# Patient Record
Sex: Female | Born: 1965
Health system: Southern US, Community
[De-identification: ages and names within clinical notes are randomized; demographics above are authoritative.]

## PROBLEM LIST (undated history)

## (undated) DIAGNOSIS — G43909 Migraine, unspecified, not intractable, without status migrainosus: Secondary | ICD-10-CM

## (undated) DIAGNOSIS — F419 Anxiety disorder, unspecified: Secondary | ICD-10-CM

## (undated) HISTORY — PX: OTHER SURGICAL HISTORY: SHX169

## (undated) HISTORY — PX: GANGLION CYST EXCISION: SHX1691

## (undated) HISTORY — PX: SPINE SURGERY: SHX786

## (undated) HISTORY — DX: Migraine, unspecified, not intractable, without status migrainosus: G43.909

---

## 2014-03-10 ENCOUNTER — Ambulatory Visit (INDEPENDENT_AMBULATORY_CARE_PROVIDER_SITE_OTHER): Payer: BC Managed Care – PPO | Admitting: Neurology

## 2014-03-10 ENCOUNTER — Encounter: Payer: Self-pay | Admitting: Neurology

## 2014-03-10 VITALS — BP 128/72 | HR 83 | Ht 62.0 in | Wt 112.0 lb

## 2014-03-10 DIAGNOSIS — R519 Headache, unspecified: Secondary | ICD-10-CM

## 2014-03-10 DIAGNOSIS — R42 Dizziness and giddiness: Secondary | ICD-10-CM | POA: Insufficient documentation

## 2014-03-10 DIAGNOSIS — R51 Headache: Secondary | ICD-10-CM

## 2014-03-10 MED ORDER — PROMETHAZINE HCL 12.5 MG PO TABS: 12.5000 mg | ORAL_TABLET | Freq: Four times a day (QID) | ORAL | Status: DC | PRN

## 2014-03-10 MED ORDER — PREDNISONE 10 MG PO TABS: ORAL_TABLET | ORAL | Status: DC

## 2014-03-10 NOTE — Progress Notes (Signed)
NEUROLOGY CONSULTATION NOTE  Catherine Hartman MRN: [id number] DOB: May 07, 1966  Referring provider: Dr. Shirlean Mylararol Webb Primary care provider: Dr. Mila PalmerSharon Wolters  Reason for consult:  Daily headaches, new dizziness  Dear Dr Hyman HopesWebb:  Thank you for your kind referral of Catherine Hartman for consultation of the above symptoms. Although her history is well known to you, please allow me to reiterate it for the purpose of our medical record. Records and images were personally reviewed where available.  HISTORY OF PRESENT ILLNESS: This is a 48 year old right-handed woman with no significant past medical history, presenting for daily headaches since age 48.  She reports having severe headaches at age 48, she recalls having biofeedback at that time and the headaches eventually resolved.  At age 48, she started having headaches over the frontal and occipital region. If localized, she would have them on the left side of her face. She describes the pain "like a knife in my brain."  She has severe nausea, photo and phonophobia.  If the pain gets to a 5/10, she would take Imitrex, otherwise she would start vomiting.  Over the past 2 months, she has had a different type of headache that are different from her migraines, with diffuse pressure and squeezing pain, and associated dizziness that lasted a day or so.  Dizziness is "not spinning," she feels that she cannot focus on anything.  Over the past 1-1/2 weeks, she has had only 2 days where she feels better, otherwise headaches and dizziness are worse in the morning. She works as a Quarry managercomputer programmer, and has difficulty looking at a computer.  Bright like makes her vision blurred, with worsened pulsating headaches and palpitations.  She feels unsteady walking, denies any falls but has bumped into things.  She has been taking Imitrex near-daily, but states that she can go 5 days without taking this.    She has been on OCPs and notices worsening of headaches on the  inert pills, however she has tried tricycling twice and had severe gastritis.  She has tried a calcium blocker in the past which made her feel "strange" with more headaches.  She denies any diplopia, dysarthria, dysphagia, bowel/bladder dysfunction.  She has some neck tension and occasional numbness in both legs.  She has a prescription for amitriptyline but has not started this yet. There is no family history of migraines.  Her maternal grandmother had a brain tumor.  Laboratory Data: 08/2013: CBC, TSH, ferritin normal.  PAST MEDICAL HISTORY: Past Medical History  Diagnosis Date  . Migraine     PAST SURGICAL HISTORY: Past Surgical History  Procedure Laterality Date  . Ganglion cyst excision  age 48    MEDICATIONS: No current outpatient prescriptions on file prior to visit.   No current facility-administered medications on file prior to visit.    ALLERGIES: Allergies  Allergen Reactions  . Tetracyclines & Related Hives    FAMILY HISTORY: Family History  Problem Relation Age of Onset  . Hypertension Father     SOCIAL HISTORY: History   Social History  . Marital Status: Married    Spouse Name: N/A    Number of Children: N/A  . Years of Education: N/A   Occupational History  . Not on file.   Social History Main Topics  . Smoking status: Current Every Day Smoker  . Smokeless tobacco: Not on file  . Alcohol Use: No  . Drug Use: No  . Sexual Activity: Not on file   Other Topics Concern  .  Not on file   Social History Narrative  . No narrative on file    REVIEW OF SYSTEMS: Constitutional: No fevers, chills, or sweats, no generalized fatigue, change in appetite Eyes: No visual changes, double vision, eye pain Ear, nose and throat: No hearing loss, ear pain, nasal congestion, sore throat Cardiovascular: No chest pain, palpitations Respiratory:  No shortness of breath at rest or with exertion, wheezes GastrointestinaI: No nausea, vomiting, diarrhea, abdominal  pain, fecal incontinence Genitourinary:  No dysuria, urinary retention or frequency Musculoskeletal:  + neck pain, back pain Integumentary: No rash, pruritus, skin lesions Neurological: as above Psychiatric: No depression, insomnia, anxiety Endocrine: No palpitations, fatigue, diaphoresis, mood swings, change in appetite, change in weight, increased thirst Hematologic/Lymphatic:  No anemia, purpura, petechiae. Allergic/Immunologic: no itchy/runny eyes, nasal congestion, recent allergic reactions, rashes  PHYSICAL EXAM: Filed Vitals:   03/10/14 1338  BP: 128/72  Pulse: 83   General: No acute distress Head:  Normocephalic/atraumatic Eyes: Fundoscopic exam shows bilateral sharp discs, no vessel changes, exudates, or hemorrhages Neck: supple, no paraspinal tenderness, full range of motion Back: No paraspinal tenderness Heart: regular rate and rhythm Lungs: Clear to auscultation bilaterally. Vascular: No carotid bruits. Skin/Extremities: No rash, no edema Neurological Exam: Mental status: alert and oriented to person, place, and time, no dysarthria or aphasia, Fund of knowledge is appropriate.  Recent and remote memory are intact.  Attention and concentration are normal.    Able to name objects and repeat phrases. Cranial nerves: CN I: not tested CN II: pupils equal, round and reactive to light, visual fields intact, fundi unremarkable. CN III, IV, VI:  full range of motion, no nystagmus, no ptosis CN V: facial sensation intact CN VII: upper and lower face symmetric CN VIII: hearing intact to finger rub CN IX, X: gag intact, uvula midline CN XI: sternocleidomastoid and trapezius muscles intact CN XII: tongue midline Bulk & Tone: normal, no fasciculations. Motor: 5/5 throughout with no pronator drift. Sensation: intact to light touch, cold, pin, vibration and joint position sense.  No extinction to double simultaneous stimulation.  Romberg test negative Deep Tendon Reflexes: +2  throughout, no ankle clonus Plantar responses: downgoing bilaterally Cerebellar: no incoordination on finger to nose, heel to shin. No dysdiadochokinesia Gait: narrow-based and steady, able to tandem walk adequately. Tremor: none  IMPRESSION: This is a 48 year old right-handed woman with a history of headaches since age 17 suggestive of migraines without aura.  She has had an increase in headache frequency and change in headaches. She also has new onset dizziness described as difficulty focusing and loss of balance, now occurring on a daily basis.  An MRI brain with and without contrast will be ordered to assess for underlying structural abnormality.  There is likely a component of medication overuse however she then stated that she can go 5 days without taking Imitrex.  We discussed minimizing rescue medication to 2-3/week. She will take phenergan for nausea associated with migraines.  She will start a course of steroids to hopefully break this headache cycle.  She will benefit from a headache prophylactic medication, and has a prescription for amitriptyline.  We discussed side effects, she will start on low dose and uptitrate as tolerated.  She will keep a headache diary and follow-up in 3 months.  Thank you for allowing me to participate in the care of this patient. Please do not hesitate to call for any questions or concerns.   Patrcia Dolly, M.D.

## 2014-03-10 NOTE — Patient Instructions (Signed)
1. MRI brain with and without contrast 2. Start Prednisone 10mg : Take 6 tabs on day 1, 5 tabs on day 2, 4 tabs on day 3, 3 tabs on day 4, 2 tabs on days 5 and 6, 1 tab on day 7 and 8. 3. Start Amitriptyline 10mg : Take 1 tab at bedtime for 2 weeks, then increase to 2 tabs at bedtime 4. May take as needed Phenergan for nausea 5. Minimize Imitrex use to 2-3 times a week to avoid rebound headaches 6. Keep a headache calendar

## 2014-03-12 ENCOUNTER — Encounter: Payer: Self-pay | Admitting: Neurology

## 2014-03-25 ENCOUNTER — Ambulatory Visit (HOSPITAL_COMMUNITY)
Admission: RE | Admit: 2014-03-25 | Discharge: 2014-03-25 | Disposition: A | Discharge status: Home / Self Care | Payer: BC Managed Care – PPO | Source: Ambulatory Visit | Attending: Neurology | Admitting: Neurology

## 2014-03-25 DIAGNOSIS — R51 Headache: Secondary | ICD-10-CM | POA: Insufficient documentation

## 2014-03-25 DIAGNOSIS — R519 Headache, unspecified: Secondary | ICD-10-CM

## 2014-03-25 DIAGNOSIS — R42 Dizziness and giddiness: Secondary | ICD-10-CM | POA: Insufficient documentation

## 2014-03-25 MED ORDER — GADOBENATE DIMEGLUMINE 529 MG/ML IV SOLN
10.0000 mL | Freq: Once | INTRAVENOUS | Status: AC
  Administered 2014-03-25: 10 mL via INTRAVENOUS

## 2014-03-27 ENCOUNTER — Telehealth: Payer: Self-pay | Admitting: Neurology

## 2014-03-27 NOTE — Telephone Encounter (Signed)
Please review MRI patient also states that her sight has getting worse she called it shadow vision(?).

## 2014-03-27 NOTE — Telephone Encounter (Signed)
Pt wants to know the results of the test and her vision is getting bad please call [phone]

## 2014-03-27 NOTE — Telephone Encounter (Signed)
Patient notified

## 2014-03-27 NOTE — Telephone Encounter (Signed)
Unable to reach patient will try again later 

## 2014-03-27 NOTE — Telephone Encounter (Signed)
Pls let her know I reviewed MRI brain, it is normal, no evidence of tumor, stroke, bleed, or inflammation. Would make sure she sees the eye doctor if she hasn't already, to make sure there is no primary eye problem as well. She can increase amitriptyline by 1 tablet at bedtime. Thanks

## 2014-07-07 ENCOUNTER — Encounter: Payer: Self-pay | Admitting: *Deleted

## 2016-04-24 DIAGNOSIS — H35421 Microcystoid degeneration of retina, right eye: Secondary | ICD-10-CM | POA: Diagnosis not present

## 2016-04-24 DIAGNOSIS — H43312 Vitreous membranes and strands, left eye: Secondary | ICD-10-CM | POA: Diagnosis not present

## 2016-04-24 DIAGNOSIS — H35422 Microcystoid degeneration of retina, left eye: Secondary | ICD-10-CM | POA: Diagnosis not present

## 2016-04-24 DIAGNOSIS — H4710 Unspecified papilledema: Secondary | ICD-10-CM | POA: Diagnosis not present

## 2016-04-24 DIAGNOSIS — G43001 Migraine without aura, not intractable, with status migrainosus: Secondary | ICD-10-CM | POA: Diagnosis not present

## 2016-04-24 DIAGNOSIS — H43311 Vitreous membranes and strands, right eye: Secondary | ICD-10-CM | POA: Diagnosis not present

## 2016-04-28 ENCOUNTER — Other Ambulatory Visit (HOSPITAL_COMMUNITY): Payer: Self-pay | Admitting: Ophthalmology

## 2016-04-28 DIAGNOSIS — R519 Headache, unspecified: Secondary | ICD-10-CM

## 2016-04-28 DIAGNOSIS — H4710 Unspecified papilledema: Secondary | ICD-10-CM

## 2016-04-28 DIAGNOSIS — R51 Headache: Secondary | ICD-10-CM

## 2016-05-01 DIAGNOSIS — Z9889 Other specified postprocedural states: Secondary | ICD-10-CM | POA: Diagnosis not present

## 2016-05-01 DIAGNOSIS — R51 Headache: Secondary | ICD-10-CM | POA: Diagnosis not present

## 2016-05-01 DIAGNOSIS — H578 Other specified disorders of eye and adnexa: Secondary | ICD-10-CM | POA: Diagnosis not present

## 2016-05-01 DIAGNOSIS — H538 Other visual disturbances: Secondary | ICD-10-CM | POA: Diagnosis not present

## 2016-05-01 DIAGNOSIS — H04123 Dry eye syndrome of bilateral lacrimal glands: Secondary | ICD-10-CM | POA: Diagnosis not present

## 2016-05-01 DIAGNOSIS — G43909 Migraine, unspecified, not intractable, without status migrainosus: Secondary | ICD-10-CM | POA: Diagnosis not present

## 2016-07-17 DIAGNOSIS — L570 Actinic keratosis: Secondary | ICD-10-CM | POA: Diagnosis not present

## 2016-07-17 DIAGNOSIS — L820 Inflamed seborrheic keratosis: Secondary | ICD-10-CM | POA: Diagnosis not present

## 2016-08-05 ENCOUNTER — Emergency Department (HOSPITAL_BASED_OUTPATIENT_CLINIC_OR_DEPARTMENT_OTHER)
Admission: EM | Admit: 2016-08-05 | Discharge: 2016-08-05 | Disposition: A | Discharge status: Home / Self Care | Payer: BLUE CROSS/BLUE SHIELD | Attending: Emergency Medicine | Admitting: Emergency Medicine

## 2016-08-05 ENCOUNTER — Encounter (HOSPITAL_BASED_OUTPATIENT_CLINIC_OR_DEPARTMENT_OTHER): Payer: Self-pay | Admitting: Emergency Medicine

## 2016-08-05 DIAGNOSIS — Z87891 Personal history of nicotine dependence: Secondary | ICD-10-CM | POA: Insufficient documentation

## 2016-08-05 DIAGNOSIS — Z79899 Other long term (current) drug therapy: Secondary | ICD-10-CM | POA: Insufficient documentation

## 2016-08-05 DIAGNOSIS — R1013 Epigastric pain: Secondary | ICD-10-CM | POA: Diagnosis not present

## 2016-08-05 DIAGNOSIS — R110 Nausea: Secondary | ICD-10-CM | POA: Insufficient documentation

## 2016-08-05 HISTORY — DX: Anxiety disorder, unspecified: F41.9

## 2016-08-05 HISTORY — DX: Migraine, unspecified, not intractable, without status migrainosus: G43.909

## 2016-08-05 LAB — LIPASE, BLOOD: LIPASE: 20 U/L (ref 11–51)

## 2016-08-05 LAB — COMPREHENSIVE METABOLIC PANEL
ALBUMIN: 4.1 g/dL (ref 3.5–5.0)
ALT: 11 U/L — AB (ref 14–54)
AST: 20 U/L (ref 15–41)
Alkaline Phosphatase: 43 U/L (ref 38–126)
Anion gap: 7 (ref 5–15)
BUN: 11 mg/dL (ref 6–20)
CHLORIDE: 108 mmol/L (ref 101–111)
CO2: 25 mmol/L (ref 22–32)
CREATININE: 0.88 mg/dL (ref 0.44–1.00)
Calcium: 9 mg/dL (ref 8.9–10.3)
GFR calc Af Amer: >60 mL/min (ref >60)
GLUCOSE: 100 mg/dL — AB (ref 65–99)
POTASSIUM: 3.6 mmol/L (ref 3.5–5.1)
Sodium: 140 mmol/L (ref 135–145)
Total Bilirubin: 0.4 mg/dL (ref 0.3–1.2)
Total Protein: 6.8 g/dL (ref 6.5–8.1)

## 2016-08-05 LAB — URINALYSIS, ROUTINE W REFLEX MICROSCOPIC
Bilirubin Urine: NEGATIVE
GLUCOSE, UA: NEGATIVE mg/dL
HGB URINE DIPSTICK: NEGATIVE
Ketones, ur: NEGATIVE mg/dL
LEUKOCYTES UA: NEGATIVE
Nitrite: NEGATIVE
PH: 7 (ref 5.0–8.0)
PROTEIN: NEGATIVE mg/dL
SPECIFIC GRAVITY, URINE: 1.014 (ref 1.005–1.030)

## 2016-08-05 LAB — CBC WITH DIFFERENTIAL/PLATELET
BASOS ABS: 0 10*3/uL (ref 0.0–0.1)
BASOS PCT: 0 %
EOS PCT: 3 %
Eosinophils Absolute: 0.2 10*3/uL (ref 0.0–0.7)
HCT: 40.7 % (ref 36.0–46.0)
Hemoglobin: 13.8 g/dL (ref 12.0–15.0)
LYMPHS PCT: 38 %
Lymphs Abs: 2.7 10*3/uL (ref 0.7–4.0)
MCH: 31.2 pg (ref 26.0–34.0)
MCHC: 33.9 g/dL (ref 30.0–36.0)
MCV: 92.1 fL (ref 78.0–100.0)
Monocytes Absolute: 0.6 10*3/uL (ref 0.1–1.0)
Monocytes Relative: 8 %
NEUTROS ABS: 3.7 10*3/uL (ref 1.7–7.7)
Neutrophils Relative %: 51 %
PLATELETS: 198 10*3/uL (ref 150–400)
RBC: 4.42 MIL/uL (ref 3.87–5.11)
RDW: 13.3 % (ref 11.5–15.5)
WBC: 7.2 10*3/uL (ref 4.0–10.5)

## 2016-08-05 LAB — PREGNANCY, URINE: Preg Test, Ur: NEGATIVE

## 2016-08-05 MED ORDER — GI COCKTAIL ~~LOC~~: 30.0000 mL | Freq: Once | ORAL | Status: DC

## 2016-08-05 MED ORDER — ONDANSETRON HCL 4 MG/2ML IJ SOLN
4.0000 mg | Freq: Once | INTRAMUSCULAR | Status: AC
  Administered 2016-08-05: 4 mg via INTRAVENOUS
  Filled 2016-08-05: qty 2

## 2016-08-05 MED ORDER — DICYCLOMINE HCL 10 MG PO CAPS
10.0000 mg | ORAL_CAPSULE | Freq: Once | ORAL | Status: AC
  Administered 2016-08-05: 10 mg via ORAL
  Filled 2016-08-05: qty 1

## 2016-08-05 MED ORDER — OXYCODONE-ACETAMINOPHEN 5-325 MG PO TABS: 1.0000 | ORAL_TABLET | ORAL | 0 refills | Status: DC | PRN

## 2016-08-05 MED ORDER — ONDANSETRON 4 MG PO TBDP: 4.0000 mg | ORAL_TABLET | Freq: Three times a day (TID) | ORAL | 0 refills | Status: DC | PRN

## 2016-08-05 MED ORDER — FAMOTIDINE IN NACL 20-0.9 MG/50ML-% IV SOLN
20.0000 mg | INTRAVENOUS | Status: AC
  Administered 2016-08-05: 20 mg via INTRAVENOUS
  Filled 2016-08-05: qty 50

## 2016-08-05 MED ORDER — MORPHINE SULFATE (PF) 4 MG/ML IV SOLN
4.0000 mg | Freq: Once | INTRAVENOUS | Status: DC
  Filled 2016-08-05: qty 1

## 2016-08-05 MED ORDER — SODIUM CHLORIDE 0.9 % IV BOLUS (SEPSIS)
1000.0000 mL | Freq: Once | INTRAVENOUS | Status: AC
  Administered 2016-08-05: 1000 mL via INTRAVENOUS

## 2016-08-05 MED ORDER — OMEPRAZOLE 20 MG PO CPDR: 20.0000 mg | DELAYED_RELEASE_CAPSULE | Freq: Every day | ORAL | 0 refills | Status: DC

## 2016-08-05 NOTE — Discharge Instructions (Signed)
Take the prescribed medication as directed. Follow-up with your primary care doctor. If symptoms do not seem to be improving, may wish to follow-up with GI doctor. Return to the ED for new or worsening symptoms.

## 2016-08-05 NOTE — ED Triage Notes (Signed)
Pt states epigastric pain off and on since about 6pm last night.  Pt reports steady ache with intermittant waves of sharp pain.  Pt also states menstrual bleeding for 3 weeks off and on.

## 2016-08-05 NOTE — ED Provider Notes (Signed)
[facility] Provider Note   CSN: [id number] Arrival date & time: 08/05/16  1345     History   Chief Complaint Chief Complaint  Patient presents with  . Abdominal Pain    HPI Catherine Hartman is a 50 y.o. female.   Abdominal Pain   Associated symptoms include nausea.   50 year old female with history of anxiety, migraine headaches, presenting to the ED for abdominal pain. She states this began around 6 PM yesterday evening. States pain has been waxing and waning in severity, is severe when it is most intense. States it feels like "spasms" in her upper abdomen.  She reports some mild nausea without vomiting or diarrhea. States she has not eaten in the past 24 hours. States pain is not necessarily worse with eating. No fever or chills. No sick contacts. No prior abdominal surgeries. No history of GI issues. States she tried some Gas-X and Pepcid at home without relief.  No chest pain or SOB.  Past Medical History:  Diagnosis Date  . Anxiety   . Migraines     There are no active problems to display for this patient.   Past Surgical History:  Procedure Laterality Date  . vocal chord polyps      OB History    No data available       Home Medications    Prior to Admission medications   Medication Sig Start Date End Date Taking? Authorizing Provider  clonazePAM (KLONOPIN) 0.5 MG tablet Take 0.5 mg by mouth 2 (two) times daily as needed for anxiety.   Yes Historical Provider, MD  SUMAtriptan (IMITREX) 25 MG tablet Take 25 mg by mouth every 2 (two) hours as needed for migraine. May repeat in 2 hours if headache persists or recurs.   Yes Historical Provider, MD    Family History No family history on file.  Social History Social History  Substance Use Topics  . Smoking status: Former Games developermoker  . Smokeless tobacco: Never Used  . Alcohol use Yes     Comment: occasional     Allergies   Augmentin [amoxicillin-pot clavulanate] and Tetracyclines &  related   Review of Systems Review of Systems  Gastrointestinal: Positive for abdominal pain and nausea.  All other systems reviewed and are negative.    Physical Exam Updated Vital Signs BP 114/83 (BP Location: Left Arm)   Pulse 92   Temp 97.9 F (36.6 C) (Oral)   Resp 18   Ht 5\' 2"  (1.575 m)   Wt 52.2 kg   LMP 07/15/2016 (Approximate)   SpO2 100%   BMI 21.03 kg/m   Physical Exam  Constitutional: She is oriented to person, place, and time. She appears well-developed and well-nourished.  HENT:  Head: Normocephalic and atraumatic.  Mouth/Throat: Oropharynx is clear and moist.  Mucous membranes moist  Eyes: Conjunctivae and EOM are normal. Pupils are equal, round, and reactive to light.  Neck: Normal range of motion.  Cardiovascular: Normal rate, regular rhythm and normal heart sounds.   Pulmonary/Chest: Effort normal and breath sounds normal. She has no wheezes. She has no rales.  Abdominal: Soft. Bowel sounds are normal. There is no tenderness. There is no rebound.  Endorses pain in epigastrium, no significant tenderness, no rebound or guarding  Musculoskeletal: Normal range of motion.  Neurological: She is alert and oriented to person, place, and time.  Skin: Skin is warm and dry.  Psychiatric: She has a normal mood and affect.  Nursing note and vitals reviewed.  ED Treatments / Results  Labs (all labs ordered are listed, but only abnormal results are displayed) Labs Reviewed  COMPREHENSIVE METABOLIC PANEL - Abnormal; Notable for the following:       Result Value   Glucose, Bld 100 (*)    ALT 11 (*)    All other components within normal limits  CBC WITH DIFFERENTIAL/PLATELET  LIPASE, BLOOD  URINALYSIS, ROUTINE W REFLEX MICROSCOPIC (NOT AT [facility])  PREGNANCY, URINE    EKG  EKG Interpretation None       Radiology No results found.  Procedures Procedures (including critical care time)  Medications Ordered in ED Medications  morphine 4 MG/ML  injection 4 mg (4 mg Intravenous Not Given 08/05/16 1446)  sodium chloride 0.9 % bolus 1,000 mL (0 mLs Intravenous Stopped 08/05/16 1602)  famotidine (PEPCID) IVPB 20 mg premix (0 mg Intravenous Stopped 08/05/16 1523)  ondansetron (ZOFRAN) injection 4 mg (4 mg Intravenous Given 08/05/16 1442)  dicyclomine (BENTYL) capsule 10 mg (10 mg Oral Given 08/05/16 1438)     Initial Impression / Assessment and Plan / ED Course  I have reviewed the triage vital signs and the nursing notes.  Pertinent labs & imaging results that were available during my care of the patient were reviewed by me and considered in my medical decision making (see chart for details).  Clinical Course   50 year old female here with abdominal pain. She is afebrile and nontoxic. Endorses pain in her epigastrium, there is no focal tenderness, rebound, guarding. No peritonitis. Reports pain waxes and wanes in nature, almost like a spasm. Lab work obtained, no acute abnormalities. Patient was treated symptomatically here with improvement. States still has some discomfort, however it is tolerable at this time. Given nature of her symptoms and benign work-up here, patient may be suffering from a gastritis. We'll continue to treat symptomatically at home. Discussed limiting spicy, acidic foods of the next few days to see if this will help. Will start on trial of Prilosec. Recommended to follow-up with PCP. She is also given GI follow-up.  Discussed plan with patient, she acknowledged understanding and agreed with plan of care.  Return precautions given for new or worsening symptoms.  Of note, patient reported to triage RN that she was having some intermittent vaginal bleeding over the past few weeks, however this was not mentioned to me during ED visit.  Final Clinical Impressions(s) / ED Diagnoses   Final diagnoses:  Epigastric pain    New Prescriptions New Prescriptions   OMEPRAZOLE (PRILOSEC) 20 MG CAPSULE    Take 1 capsule (20 mg  total) by mouth daily.   ONDANSETRON (ZOFRAN ODT) 4 MG DISINTEGRATING TABLET    Take 1 tablet (4 mg total) by mouth every 8 (eight) hours as needed for nausea.   OXYCODONE-ACETAMINOPHEN (PERCOCET/ROXICET) 5-325 MG TABLET    Take 1 tablet by mouth every 4 (four) hours as needed.     Garlon HatchetLisa M Demeka Sutter, PA-C 08/05/16 1604    Melene Planan Floyd, DO 08/06/16 1055

## 2016-08-06 ENCOUNTER — Encounter (HOSPITAL_BASED_OUTPATIENT_CLINIC_OR_DEPARTMENT_OTHER): Payer: Self-pay | Admitting: Emergency Medicine

## 2017-02-05 ENCOUNTER — Other Ambulatory Visit: Payer: Self-pay | Admitting: Family Medicine

## 2017-02-05 ENCOUNTER — Other Ambulatory Visit (HOSPITAL_COMMUNITY)
Admission: RE | Admit: 2017-02-05 | Discharge: 2017-02-05 | Disposition: A | Discharge status: Home / Self Care | Payer: BLUE CROSS/BLUE SHIELD | Source: Ambulatory Visit | Attending: Family Medicine | Admitting: Family Medicine

## 2017-02-05 DIAGNOSIS — N920 Excessive and frequent menstruation with regular cycle: Secondary | ICD-10-CM | POA: Diagnosis not present

## 2017-02-05 DIAGNOSIS — Z1151 Encounter for screening for human papillomavirus (HPV): Secondary | ICD-10-CM | POA: Diagnosis not present

## 2017-02-05 DIAGNOSIS — N951 Menopausal and female climacteric states: Secondary | ICD-10-CM | POA: Diagnosis not present

## 2017-02-05 DIAGNOSIS — R102 Pelvic and perineal pain: Secondary | ICD-10-CM | POA: Diagnosis not present

## 2017-02-05 DIAGNOSIS — Z01411 Encounter for gynecological examination (general) (routine) with abnormal findings: Secondary | ICD-10-CM | POA: Insufficient documentation

## 2017-02-05 DIAGNOSIS — Z124 Encounter for screening for malignant neoplasm of cervix: Secondary | ICD-10-CM | POA: Diagnosis not present

## 2017-02-07 ENCOUNTER — Other Ambulatory Visit: Payer: Self-pay | Admitting: Family Medicine

## 2017-02-07 DIAGNOSIS — R102 Pelvic and perineal pain: Secondary | ICD-10-CM

## 2017-02-07 DIAGNOSIS — N920 Excessive and frequent menstruation with regular cycle: Secondary | ICD-10-CM

## 2017-02-08 LAB — CYTOLOGY - PAP
ADEQUACY: ABSENT
Diagnosis: NEGATIVE
HPV (WINDOPATH): NOT DETECTED

## 2017-02-13 ENCOUNTER — Ambulatory Visit
Admission: RE | Admit: 2017-02-13 | Discharge: 2017-02-13 | Disposition: A | Discharge status: Home / Self Care | Payer: BLUE CROSS/BLUE SHIELD | Source: Ambulatory Visit | Attending: Family Medicine | Admitting: Family Medicine

## 2017-02-13 DIAGNOSIS — R102 Pelvic and perineal pain: Secondary | ICD-10-CM

## 2017-02-13 DIAGNOSIS — N920 Excessive and frequent menstruation with regular cycle: Secondary | ICD-10-CM

## 2017-10-16 DIAGNOSIS — L820 Inflamed seborrheic keratosis: Secondary | ICD-10-CM | POA: Diagnosis not present

## 2017-10-16 DIAGNOSIS — L814 Other melanin hyperpigmentation: Secondary | ICD-10-CM | POA: Diagnosis not present

## 2017-10-16 DIAGNOSIS — D225 Melanocytic nevi of trunk: Secondary | ICD-10-CM | POA: Diagnosis not present

## 2017-10-16 DIAGNOSIS — D2239 Melanocytic nevi of other parts of face: Secondary | ICD-10-CM | POA: Diagnosis not present

## 2017-10-16 DIAGNOSIS — L821 Other seborrheic keratosis: Secondary | ICD-10-CM | POA: Diagnosis not present

## 2017-10-16 DIAGNOSIS — D229 Melanocytic nevi, unspecified: Secondary | ICD-10-CM | POA: Diagnosis not present

## 2018-09-29 IMAGING — US US TRANSVAGINAL NON-OB
1 series · 14 of 25 positions shown · non-contrast
Comparison: None

CLINICAL DATA: 51-year-old female with heavy menstrual cycles.
Pelvic pain for 1 year. Initial encounter.



[Series 1: us transvaginal non-ob · 0.18mm/px · 14 of 85 slices shown]
[im 1/85]
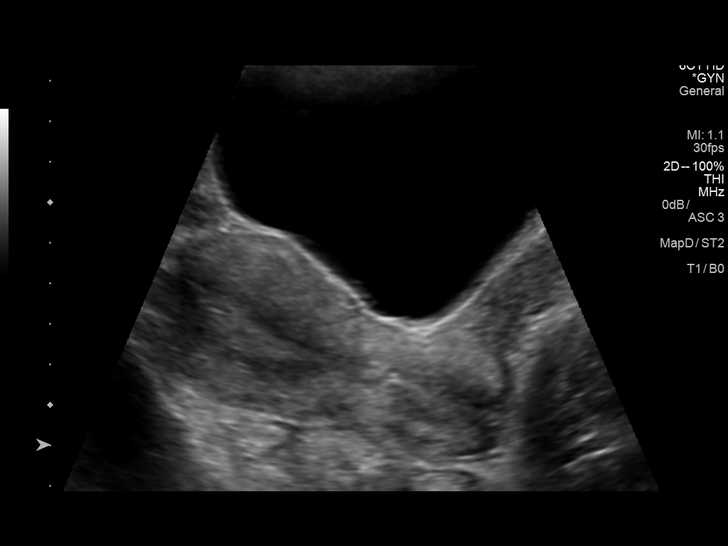
[im 8/85]
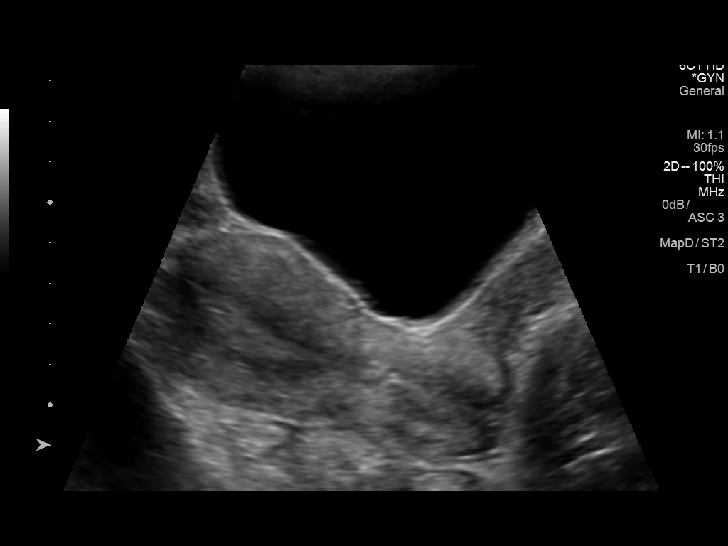
[im 15/85]
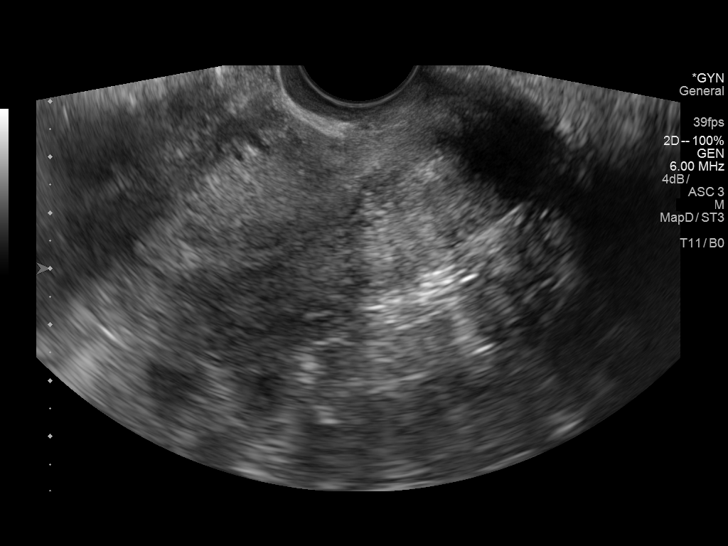
[im 22/85]
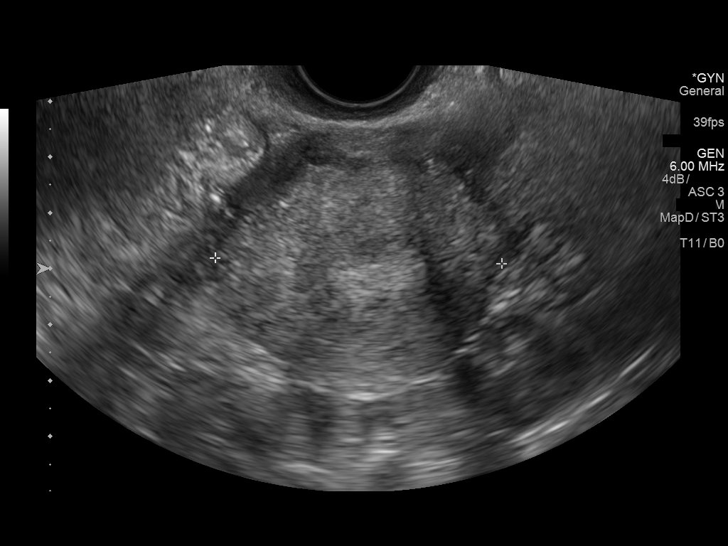
[im 29/85]
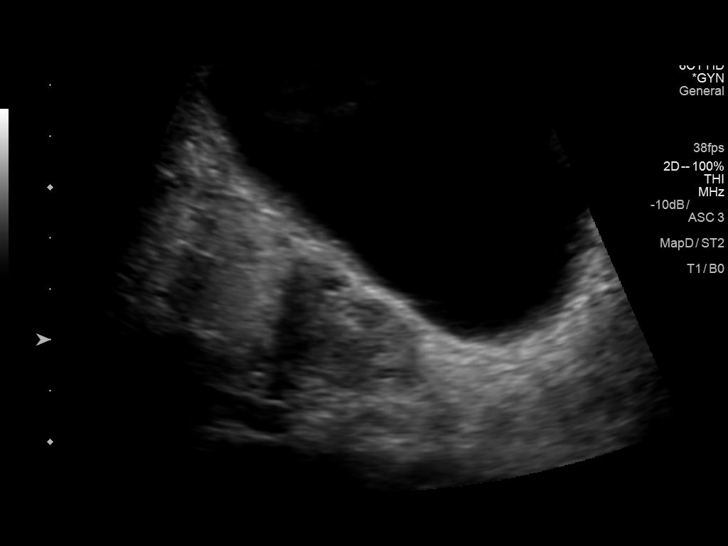
[im 32/85]
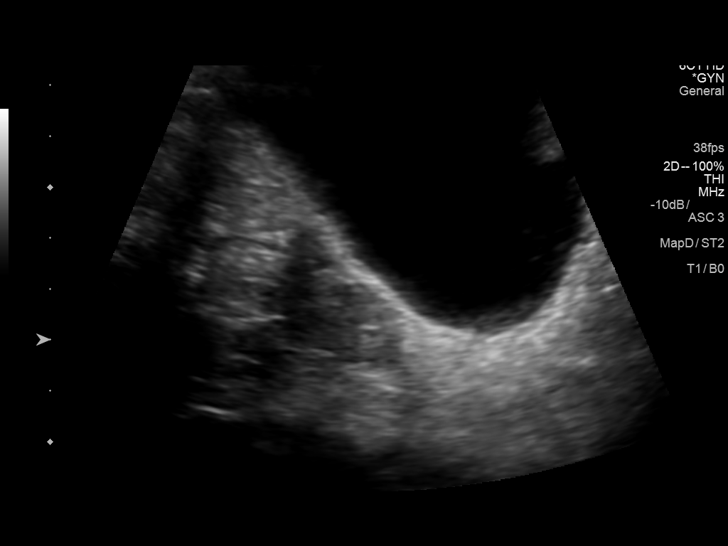
[im 39/85]
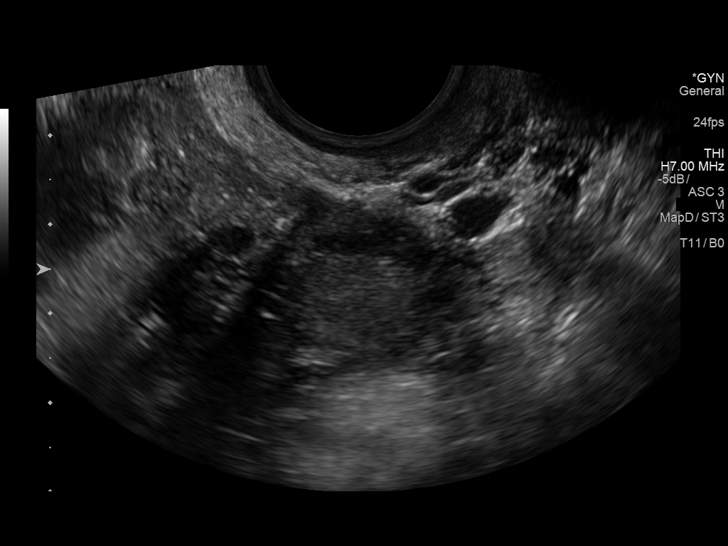
[im 46/85]
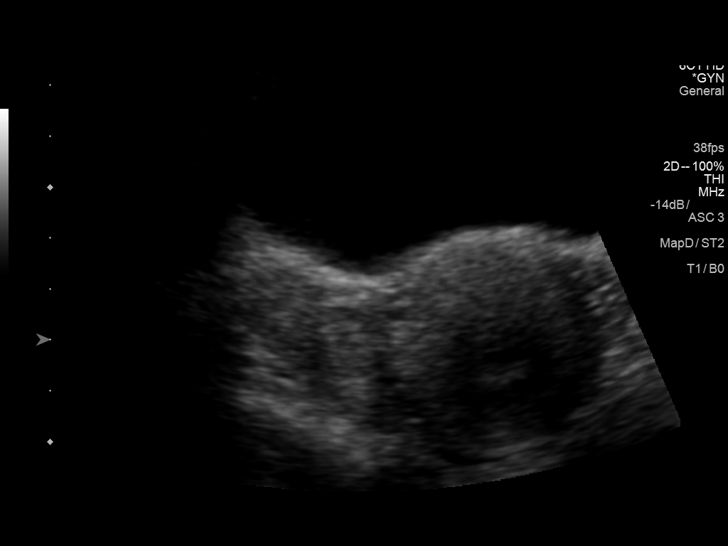
[im 53/85]
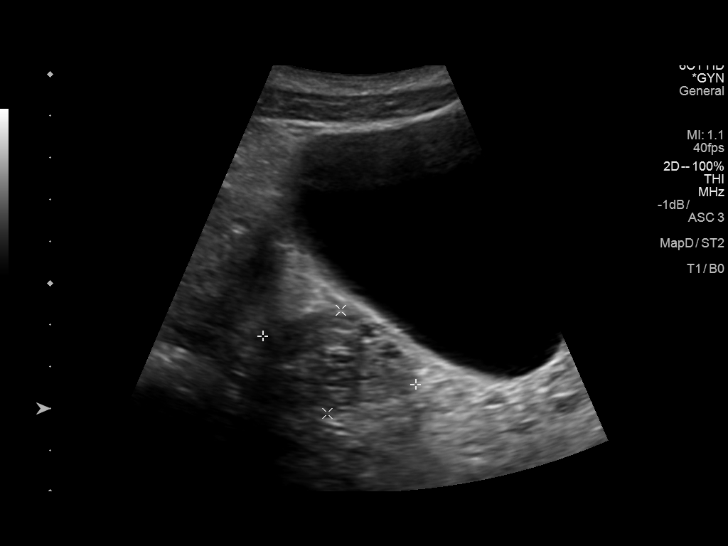
[im 57/85]
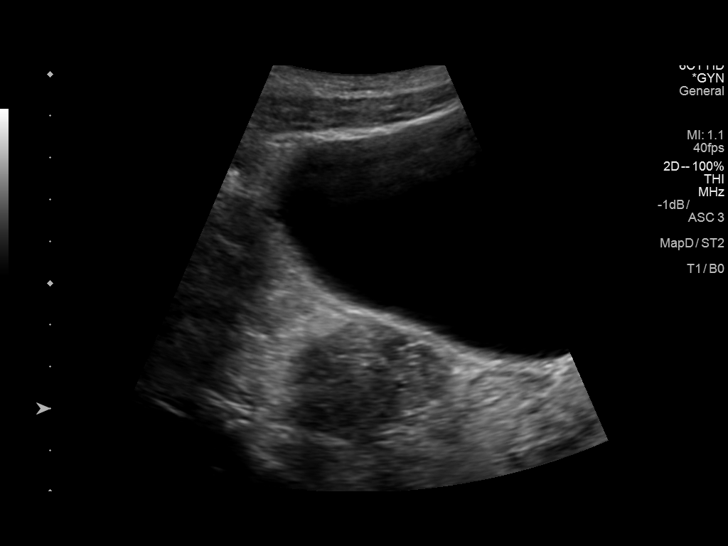
[im 64/85]
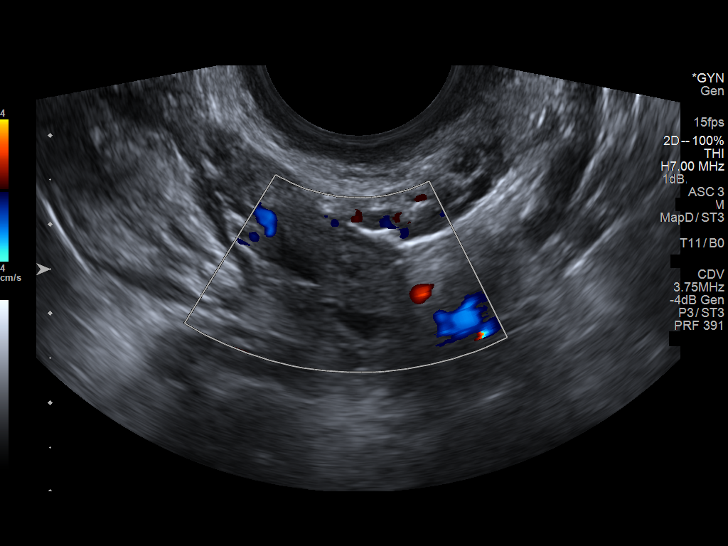
[im 71/85]
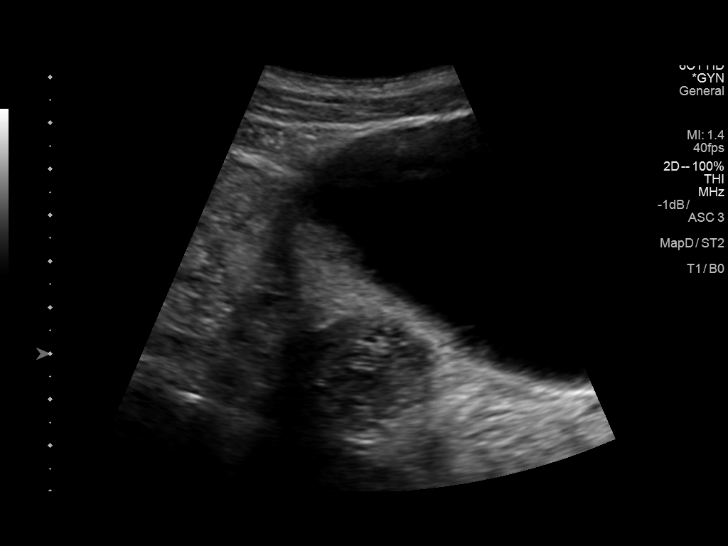
[im 78/85]
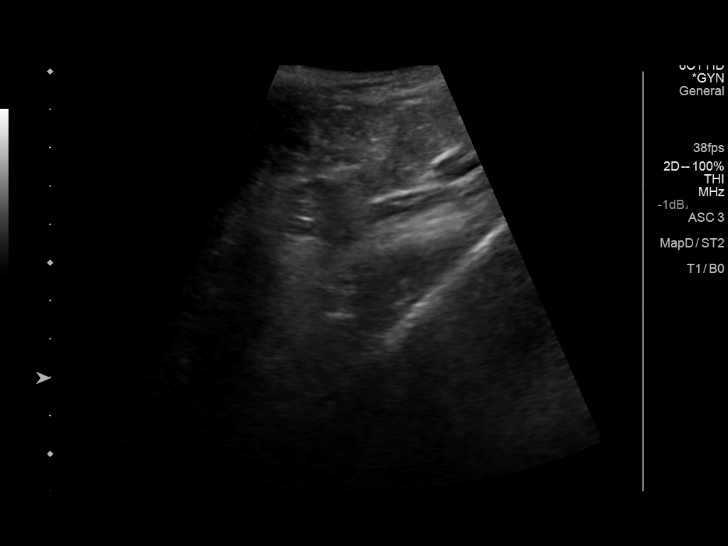
[im 85/85]
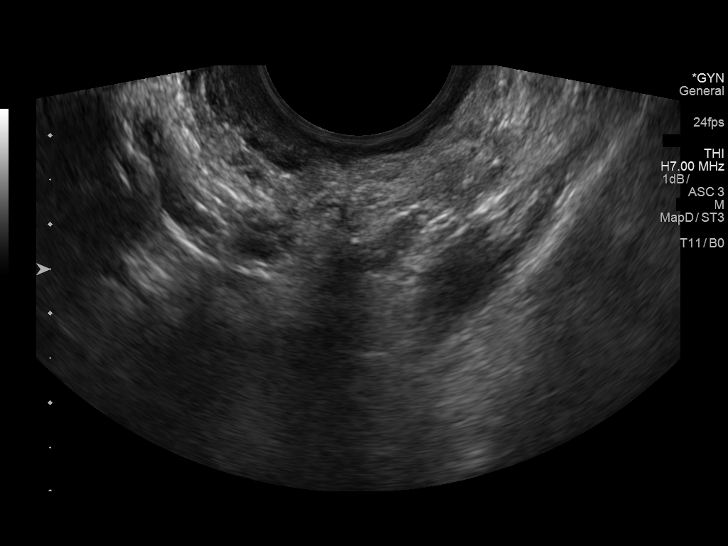

[14 of 25 positions shown; findings below may reference images not displayed]

FINDINGS: Uterus

Measurements: 8.9 x 4.4 x 5.5 cm.  No fibroids detected.

Endometrium

Thickness: 10 mm.  No focal abnormality visualized.

Right ovary

Measurements: 3 x 1.5 x 1.7 cm. Normal appearance/no adnexal mass.

Left ovary

Measurements: 3.2 x 1.9 x 1.9 cm. Normal appearance/no adnexal mass.

Other findings

No abnormal free fluid.
IMPRESSION: Negative exam. No evidence of uterine fibroids or focal endometrial
abnormality.

## 2018-12-06 DIAGNOSIS — N952 Postmenopausal atrophic vaginitis: Secondary | ICD-10-CM | POA: Diagnosis not present

## 2018-12-06 DIAGNOSIS — R102 Pelvic and perineal pain: Secondary | ICD-10-CM | POA: Diagnosis not present

## 2018-12-17 DIAGNOSIS — Z1211 Encounter for screening for malignant neoplasm of colon: Secondary | ICD-10-CM | POA: Diagnosis not present

## 2018-12-17 DIAGNOSIS — F411 Generalized anxiety disorder: Secondary | ICD-10-CM | POA: Diagnosis not present

## 2018-12-17 DIAGNOSIS — G43909 Migraine, unspecified, not intractable, without status migrainosus: Secondary | ICD-10-CM | POA: Diagnosis not present

## 2018-12-17 DIAGNOSIS — Z5181 Encounter for therapeutic drug level monitoring: Secondary | ICD-10-CM | POA: Diagnosis not present

## 2019-01-30 DIAGNOSIS — N951 Menopausal and female climacteric states: Secondary | ICD-10-CM | POA: Diagnosis not present

## 2019-01-30 DIAGNOSIS — N952 Postmenopausal atrophic vaginitis: Secondary | ICD-10-CM | POA: Diagnosis not present

## 2019-01-30 DIAGNOSIS — R102 Pelvic and perineal pain: Secondary | ICD-10-CM | POA: Diagnosis not present

## 2019-02-10 DIAGNOSIS — G43009 Migraine without aura, not intractable, without status migrainosus: Secondary | ICD-10-CM | POA: Diagnosis not present

## 2019-02-10 DIAGNOSIS — R112 Nausea with vomiting, unspecified: Secondary | ICD-10-CM | POA: Diagnosis not present

## 2019-04-23 DIAGNOSIS — R05 Cough: Secondary | ICD-10-CM | POA: Diagnosis not present

## 2019-04-23 DIAGNOSIS — Z1159 Encounter for screening for other viral diseases: Secondary | ICD-10-CM | POA: Diagnosis not present

## 2019-05-21 DIAGNOSIS — R891 Abnormal level of hormones in specimens from other organs, systems and tissues: Secondary | ICD-10-CM | POA: Diagnosis not present

## 2019-05-21 DIAGNOSIS — R5383 Other fatigue: Secondary | ICD-10-CM | POA: Diagnosis not present

## 2019-08-24 DIAGNOSIS — K1121 Acute sialoadenitis: Secondary | ICD-10-CM | POA: Diagnosis not present

## 2019-09-10 DIAGNOSIS — J0100 Acute maxillary sinusitis, unspecified: Secondary | ICD-10-CM | POA: Diagnosis not present

## 2019-10-14 DIAGNOSIS — C44511 Basal cell carcinoma of skin of breast: Secondary | ICD-10-CM | POA: Diagnosis not present

## 2019-10-14 DIAGNOSIS — L570 Actinic keratosis: Secondary | ICD-10-CM | POA: Diagnosis not present

## 2019-10-14 DIAGNOSIS — D485 Neoplasm of uncertain behavior of skin: Secondary | ICD-10-CM | POA: Diagnosis not present

## 2019-12-01 DIAGNOSIS — R070 Pain in throat: Secondary | ICD-10-CM | POA: Diagnosis not present

## 2019-12-01 DIAGNOSIS — K119 Disease of salivary gland, unspecified: Secondary | ICD-10-CM | POA: Diagnosis not present

## 2020-01-02 DIAGNOSIS — C44511 Basal cell carcinoma of skin of breast: Secondary | ICD-10-CM | POA: Diagnosis not present

## 2020-01-02 DIAGNOSIS — L905 Scar conditions and fibrosis of skin: Secondary | ICD-10-CM | POA: Diagnosis not present

## 2020-01-06 DIAGNOSIS — Z0000 Encounter for general adult medical examination without abnormal findings: Secondary | ICD-10-CM | POA: Diagnosis not present

## 2020-01-06 DIAGNOSIS — Z13220 Encounter for screening for lipoid disorders: Secondary | ICD-10-CM | POA: Diagnosis not present

## 2020-01-06 DIAGNOSIS — Z5181 Encounter for therapeutic drug level monitoring: Secondary | ICD-10-CM | POA: Diagnosis not present

## 2020-01-12 DIAGNOSIS — C44519 Basal cell carcinoma of skin of other part of trunk: Secondary | ICD-10-CM | POA: Diagnosis not present

## 2020-01-12 DIAGNOSIS — L821 Other seborrheic keratosis: Secondary | ICD-10-CM | POA: Diagnosis not present

## 2020-01-12 DIAGNOSIS — Z85828 Personal history of other malignant neoplasm of skin: Secondary | ICD-10-CM | POA: Diagnosis not present

## 2020-01-12 DIAGNOSIS — L905 Scar conditions and fibrosis of skin: Secondary | ICD-10-CM | POA: Diagnosis not present

## 2020-01-12 DIAGNOSIS — D485 Neoplasm of uncertain behavior of skin: Secondary | ICD-10-CM | POA: Diagnosis not present

## 2020-01-12 DIAGNOSIS — L570 Actinic keratosis: Secondary | ICD-10-CM | POA: Diagnosis not present

## 2020-01-19 DIAGNOSIS — S31103D Unspecified open wound of abdominal wall, right lower quadrant without penetration into peritoneal cavity, subsequent encounter: Secondary | ICD-10-CM | POA: Diagnosis not present

## 2020-01-19 DIAGNOSIS — C44511 Basal cell carcinoma of skin of breast: Secondary | ICD-10-CM | POA: Diagnosis not present

## 2020-01-28 DIAGNOSIS — L820 Inflamed seborrheic keratosis: Secondary | ICD-10-CM | POA: Diagnosis not present

## 2020-01-28 DIAGNOSIS — C44519 Basal cell carcinoma of skin of other part of trunk: Secondary | ICD-10-CM | POA: Diagnosis not present

## 2020-03-17 DIAGNOSIS — B373 Candidiasis of vulva and vagina: Secondary | ICD-10-CM | POA: Diagnosis not present

## 2020-04-29 ENCOUNTER — Other Ambulatory Visit: Payer: Self-pay | Admitting: Family Medicine

## 2020-04-29 DIAGNOSIS — R399 Unspecified symptoms and signs involving the genitourinary system: Secondary | ICD-10-CM | POA: Diagnosis not present

## 2020-04-29 DIAGNOSIS — N898 Other specified noninflammatory disorders of vagina: Secondary | ICD-10-CM | POA: Diagnosis not present

## 2020-04-29 DIAGNOSIS — Z113 Encounter for screening for infections with a predominantly sexual mode of transmission: Secondary | ICD-10-CM | POA: Diagnosis not present

## 2020-04-29 DIAGNOSIS — F409 Phobic anxiety disorder, unspecified: Secondary | ICD-10-CM | POA: Diagnosis not present

## 2020-04-29 DIAGNOSIS — R229 Localized swelling, mass and lump, unspecified: Secondary | ICD-10-CM

## 2020-05-11 ENCOUNTER — Ambulatory Visit
Admission: RE | Admit: 2020-05-11 | Discharge: 2020-05-11 | Disposition: A | Discharge status: Home / Self Care | Payer: BC Managed Care – PPO | Source: Ambulatory Visit | Attending: Family Medicine | Admitting: Family Medicine

## 2020-05-11 DIAGNOSIS — R2242 Localized swelling, mass and lump, left lower limb: Secondary | ICD-10-CM | POA: Diagnosis not present

## 2020-05-11 DIAGNOSIS — R229 Localized swelling, mass and lump, unspecified: Secondary | ICD-10-CM

## 2020-09-24 IMAGING — US US EXTREM UP*L* LTD
1 series · 14 of 19 positions shown · non-contrast
Comparison: None.

CLINICAL DATA: Nodule in the left calf.

EXAM:
ULTRASOUND LEFT CALF EXTREMITY LIMITED
TECHNIQUE: Ultrasound examination of the upper extremity soft tissues was
performed in the area of clinical concern.

[Series 1: us extrem up*left* ltd · 0.04mm/px · 19 acquisitions, 14 frames shown]
[im 1/19]
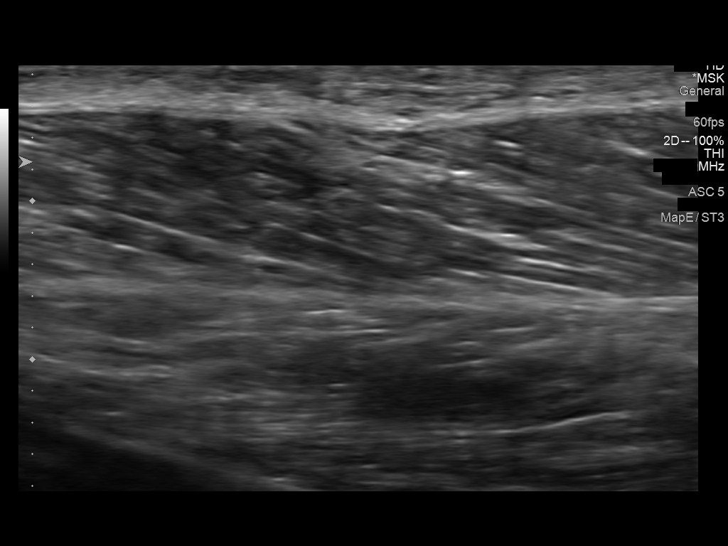
[im 3/19]
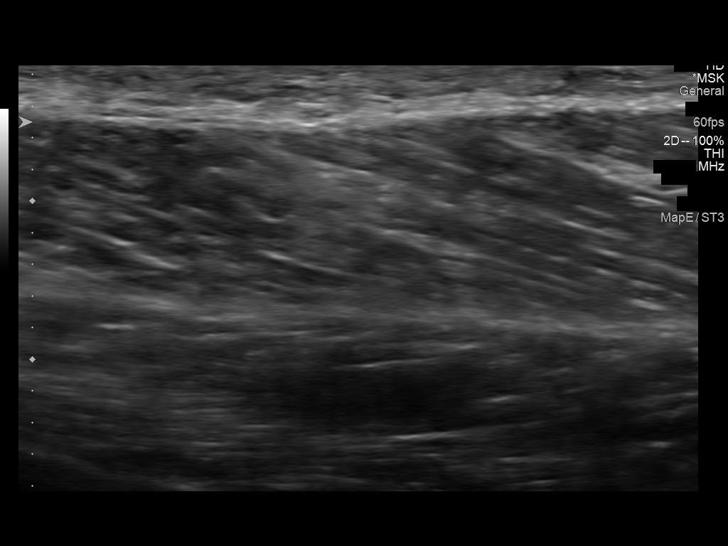
[im 4/19]
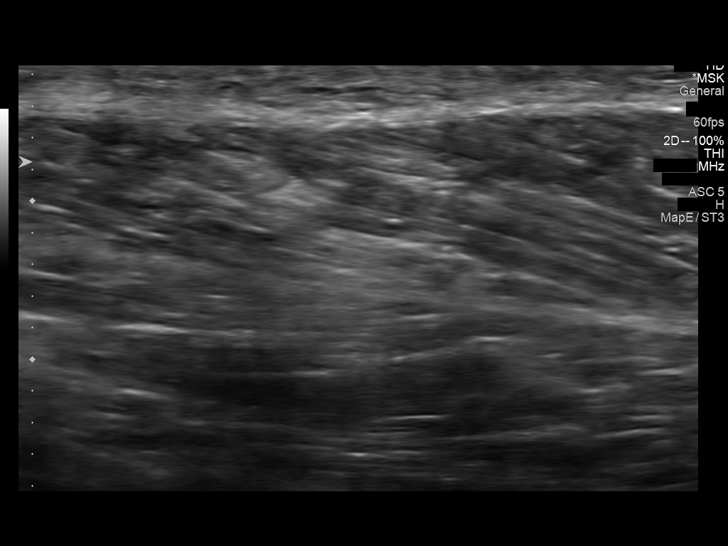
[im 5/19]
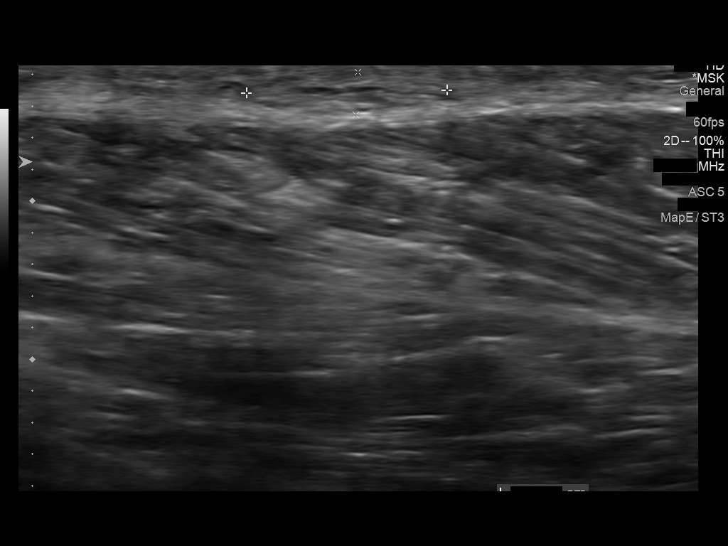
[im 7/19]
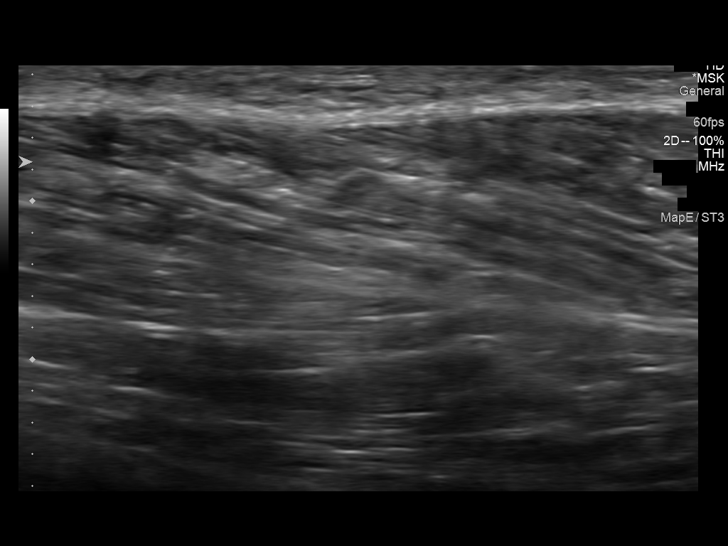
[im 8/19]
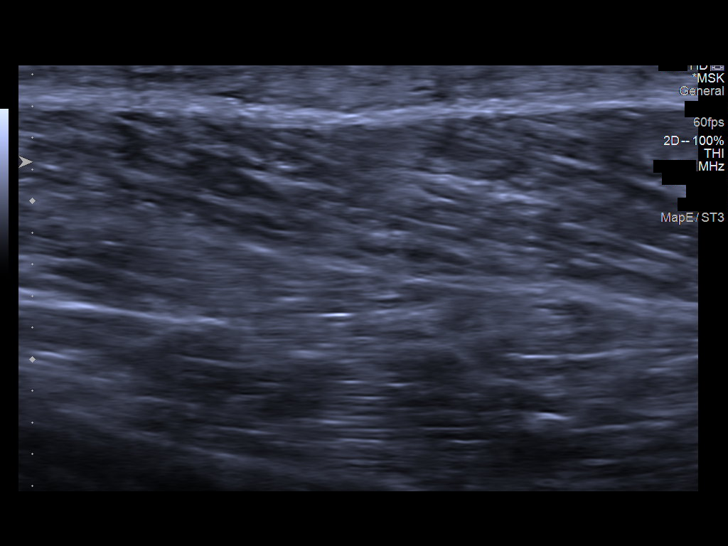
[im 9/19]
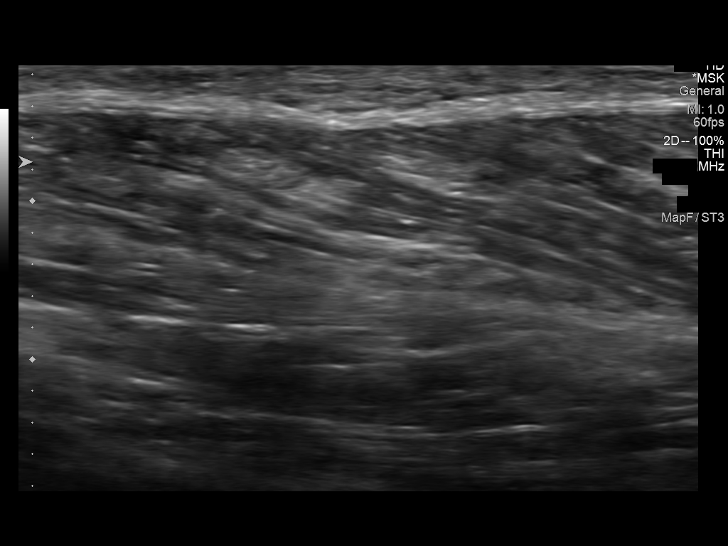
[im 11/19]
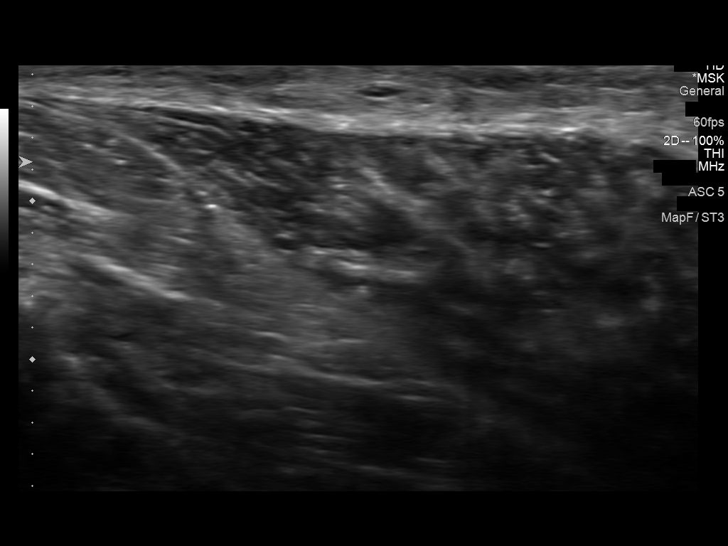
[im 12/19]
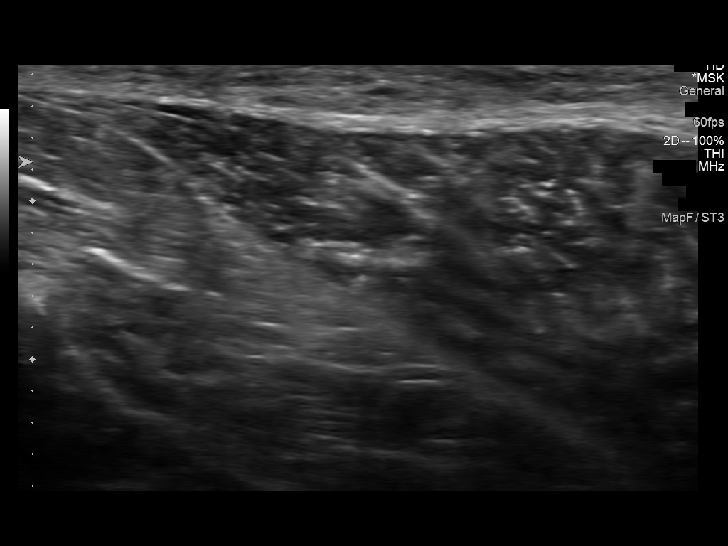
[im 13/19]
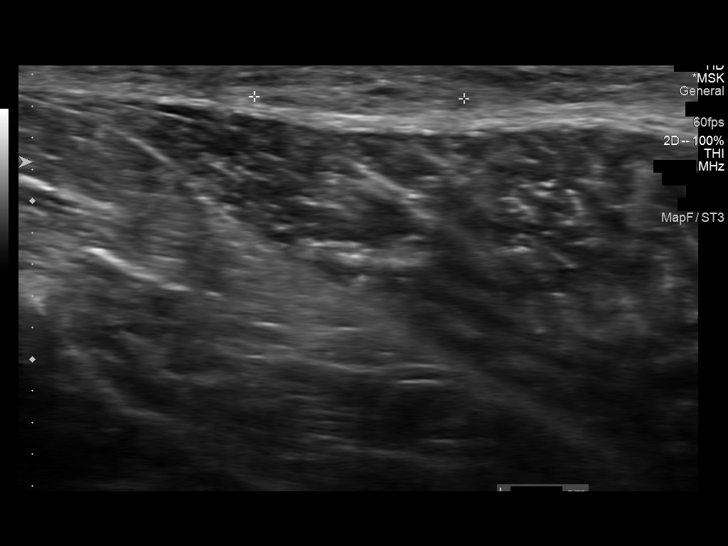
[im 15/19]
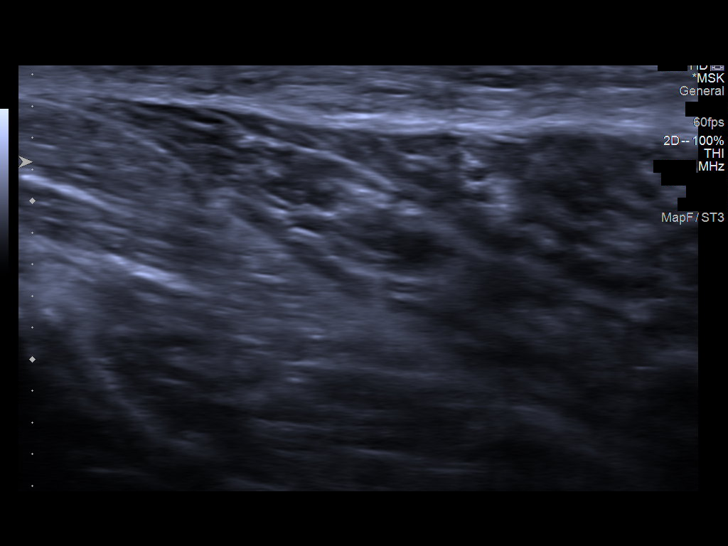
[im 16/19]
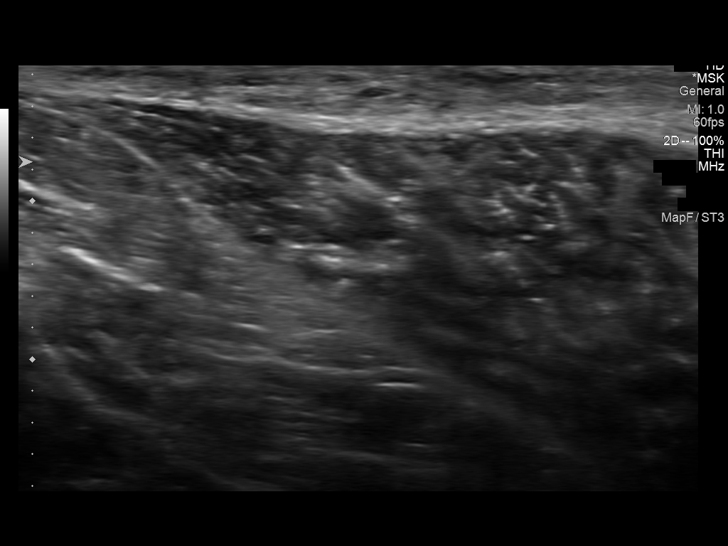
[im 17/19]
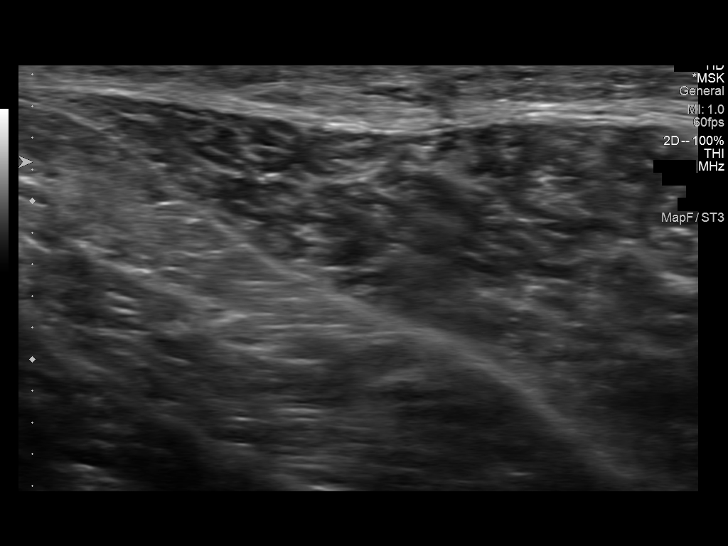
[im 19/19]
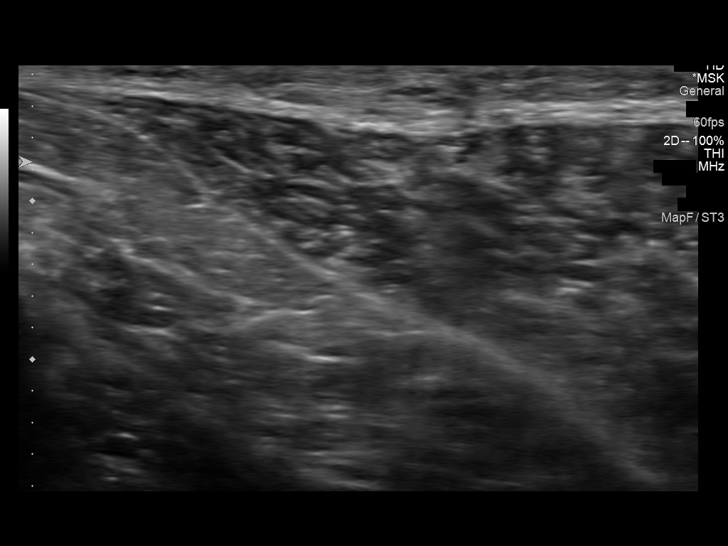

[14 of 19 positions shown; findings below may reference images not displayed]

FINDINGS: Scanning was directed toward the region of concern as indicated by
the patient. No nodule, mass or fluid collection is identified.
IMPRESSION: Negative exam.

## 2021-09-08 DIAGNOSIS — H04123 Dry eye syndrome of bilateral lacrimal glands: Secondary | ICD-10-CM | POA: Diagnosis not present

## 2021-09-08 DIAGNOSIS — H47393 Other disorders of optic disc, bilateral: Secondary | ICD-10-CM | POA: Diagnosis not present

## 2021-09-08 DIAGNOSIS — H16403 Unspecified corneal neovascularization, bilateral: Secondary | ICD-10-CM | POA: Diagnosis not present

## 2021-09-08 DIAGNOSIS — H47332 Pseudopapilledema of optic disc, left eye: Secondary | ICD-10-CM | POA: Diagnosis not present

## 2021-09-29 ENCOUNTER — Ambulatory Visit: Payer: Self-pay | Admitting: Orthopedic Surgery

## 2021-09-29 DIAGNOSIS — M48062 Spinal stenosis, lumbar region with neurogenic claudication: Secondary | ICD-10-CM

## 2021-10-13 DIAGNOSIS — F5101 Primary insomnia: Secondary | ICD-10-CM | POA: Diagnosis not present

## 2021-10-13 DIAGNOSIS — R350 Frequency of micturition: Secondary | ICD-10-CM | POA: Diagnosis not present

## 2021-10-13 DIAGNOSIS — F411 Generalized anxiety disorder: Secondary | ICD-10-CM | POA: Diagnosis not present

## 2021-10-18 ENCOUNTER — Other Ambulatory Visit (HOSPITAL_COMMUNITY): Payer: BC Managed Care – PPO

## 2021-10-20 ENCOUNTER — Ambulatory Visit (HOSPITAL_COMMUNITY): Admission: RE | Admit: 2021-10-20 | Payer: BC Managed Care – PPO | Source: Home / Self Care | Admitting: Specialist

## 2021-10-20 ENCOUNTER — Encounter (HOSPITAL_COMMUNITY): Admission: RE | Payer: Self-pay | Source: Home / Self Care

## 2021-10-20 SURGERY — LUMBAR LAMINECTOMY/DECOMPRESSION MICRODISCECTOMY 1 LEVEL
Anesthesia: General

## 2021-10-25 DIAGNOSIS — L814 Other melanin hyperpigmentation: Secondary | ICD-10-CM | POA: Diagnosis not present

## 2021-10-25 DIAGNOSIS — L821 Other seborrheic keratosis: Secondary | ICD-10-CM | POA: Diagnosis not present

## 2021-10-25 DIAGNOSIS — D485 Neoplasm of uncertain behavior of skin: Secondary | ICD-10-CM | POA: Diagnosis not present

## 2021-10-25 DIAGNOSIS — C44719 Basal cell carcinoma of skin of left lower limb, including hip: Secondary | ICD-10-CM | POA: Diagnosis not present

## 2021-10-25 DIAGNOSIS — D2272 Melanocytic nevi of left lower limb, including hip: Secondary | ICD-10-CM | POA: Diagnosis not present

## 2021-10-25 DIAGNOSIS — C44619 Basal cell carcinoma of skin of left upper limb, including shoulder: Secondary | ICD-10-CM | POA: Diagnosis not present

## 2021-10-25 DIAGNOSIS — C44629 Squamous cell carcinoma of skin of left upper limb, including shoulder: Secondary | ICD-10-CM | POA: Diagnosis not present

## 2021-10-25 DIAGNOSIS — D225 Melanocytic nevi of trunk: Secondary | ICD-10-CM | POA: Diagnosis not present

## 2021-10-25 DIAGNOSIS — L249 Irritant contact dermatitis, unspecified cause: Secondary | ICD-10-CM | POA: Diagnosis not present

## 2021-11-22 DIAGNOSIS — M84351A Stress fracture, right femur, initial encounter for fracture: Secondary | ICD-10-CM | POA: Diagnosis not present

## 2021-11-22 DIAGNOSIS — M25562 Pain in left knee: Secondary | ICD-10-CM | POA: Diagnosis not present

## 2021-11-22 DIAGNOSIS — M545 Low back pain, unspecified: Secondary | ICD-10-CM | POA: Diagnosis not present

## 2021-11-22 DIAGNOSIS — S83242D Other tear of medial meniscus, current injury, left knee, subsequent encounter: Secondary | ICD-10-CM | POA: Diagnosis not present

## 2021-12-06 DIAGNOSIS — N950 Postmenopausal bleeding: Secondary | ICD-10-CM | POA: Diagnosis not present

## 2021-12-06 DIAGNOSIS — N959 Unspecified menopausal and perimenopausal disorder: Secondary | ICD-10-CM | POA: Diagnosis not present

## 2021-12-06 DIAGNOSIS — G43909 Migraine, unspecified, not intractable, without status migrainosus: Secondary | ICD-10-CM | POA: Diagnosis not present

## 2021-12-22 DIAGNOSIS — M5416 Radiculopathy, lumbar region: Secondary | ICD-10-CM | POA: Diagnosis not present

## 2022-01-06 DIAGNOSIS — C44519 Basal cell carcinoma of skin of other part of trunk: Secondary | ICD-10-CM | POA: Diagnosis not present

## 2022-01-06 DIAGNOSIS — D0462 Carcinoma in situ of skin of left upper limb, including shoulder: Secondary | ICD-10-CM | POA: Diagnosis not present

## 2022-01-06 DIAGNOSIS — D485 Neoplasm of uncertain behavior of skin: Secondary | ICD-10-CM | POA: Diagnosis not present

## 2022-01-06 DIAGNOSIS — C44529 Squamous cell carcinoma of skin of other part of trunk: Secondary | ICD-10-CM | POA: Diagnosis not present

## 2022-01-06 DIAGNOSIS — C44619 Basal cell carcinoma of skin of left upper limb, including shoulder: Secondary | ICD-10-CM | POA: Diagnosis not present

## 2022-01-06 DIAGNOSIS — C44719 Basal cell carcinoma of skin of left lower limb, including hip: Secondary | ICD-10-CM | POA: Diagnosis not present

## 2022-01-11 ENCOUNTER — Other Ambulatory Visit (HOSPITAL_COMMUNITY)
Admission: RE | Admit: 2022-01-11 | Discharge: 2022-01-11 | Disposition: A | Discharge status: Home / Self Care | Payer: BC Managed Care – PPO | Source: Ambulatory Visit | Attending: Nurse Practitioner | Admitting: Nurse Practitioner

## 2022-01-11 ENCOUNTER — Other Ambulatory Visit: Payer: Self-pay

## 2022-01-11 ENCOUNTER — Other Ambulatory Visit: Payer: Self-pay | Admitting: Nurse Practitioner

## 2022-01-11 DIAGNOSIS — N950 Postmenopausal bleeding: Secondary | ICD-10-CM | POA: Diagnosis not present

## 2022-01-11 DIAGNOSIS — Z124 Encounter for screening for malignant neoplasm of cervix: Secondary | ICD-10-CM | POA: Insufficient documentation

## 2022-01-11 DIAGNOSIS — N858 Other specified noninflammatory disorders of uterus: Secondary | ICD-10-CM | POA: Diagnosis not present

## 2022-01-11 DIAGNOSIS — N841 Polyp of cervix uteri: Secondary | ICD-10-CM | POA: Diagnosis not present

## 2022-01-13 LAB — CYTOLOGY - PAP
Comment: NEGATIVE
Comment: NEGATIVE
Comment: NEGATIVE
Diagnosis: UNDETERMINED — AB
HPV 16: NEGATIVE
HPV 18 / 45: NEGATIVE
High risk HPV: POSITIVE — AB

## 2022-01-18 DIAGNOSIS — N950 Postmenopausal bleeding: Secondary | ICD-10-CM | POA: Diagnosis not present

## 2022-01-18 DIAGNOSIS — R87610 Atypical squamous cells of undetermined significance on cytologic smear of cervix (ASC-US): Secondary | ICD-10-CM | POA: Diagnosis not present

## 2022-01-18 DIAGNOSIS — R87810 Cervical high risk human papillomavirus (HPV) DNA test positive: Secondary | ICD-10-CM | POA: Diagnosis not present

## 2022-01-18 DIAGNOSIS — N841 Polyp of cervix uteri: Secondary | ICD-10-CM | POA: Diagnosis not present

## 2022-02-01 DIAGNOSIS — R87810 Cervical high risk human papillomavirus (HPV) DNA test positive: Secondary | ICD-10-CM | POA: Diagnosis not present

## 2022-02-01 DIAGNOSIS — N950 Postmenopausal bleeding: Secondary | ICD-10-CM | POA: Diagnosis not present

## 2022-02-01 DIAGNOSIS — R87610 Atypical squamous cells of undetermined significance on cytologic smear of cervix (ASC-US): Secondary | ICD-10-CM | POA: Diagnosis not present

## 2022-02-01 DIAGNOSIS — N841 Polyp of cervix uteri: Secondary | ICD-10-CM | POA: Diagnosis not present

## 2022-02-16 DIAGNOSIS — F411 Generalized anxiety disorder: Secondary | ICD-10-CM | POA: Diagnosis not present

## 2022-02-16 DIAGNOSIS — M25562 Pain in left knee: Secondary | ICD-10-CM | POA: Diagnosis not present

## 2022-02-16 DIAGNOSIS — Z13220 Encounter for screening for lipoid disorders: Secondary | ICD-10-CM | POA: Diagnosis not present

## 2022-02-16 DIAGNOSIS — G43909 Migraine, unspecified, not intractable, without status migrainosus: Secondary | ICD-10-CM | POA: Diagnosis not present

## 2022-02-16 DIAGNOSIS — N959 Unspecified menopausal and perimenopausal disorder: Secondary | ICD-10-CM | POA: Diagnosis not present

## 2022-02-16 DIAGNOSIS — Z0000 Encounter for general adult medical examination without abnormal findings: Secondary | ICD-10-CM | POA: Diagnosis not present

## 2022-03-01 DIAGNOSIS — S83242D Other tear of medial meniscus, current injury, left knee, subsequent encounter: Secondary | ICD-10-CM | POA: Diagnosis not present

## 2022-03-01 DIAGNOSIS — M25562 Pain in left knee: Secondary | ICD-10-CM | POA: Diagnosis not present

## 2022-03-01 DIAGNOSIS — M84351A Stress fracture, right femur, initial encounter for fracture: Secondary | ICD-10-CM | POA: Diagnosis not present

## 2022-03-13 DIAGNOSIS — M25562 Pain in left knee: Secondary | ICD-10-CM | POA: Diagnosis not present

## 2022-03-24 DIAGNOSIS — M84351A Stress fracture, right femur, initial encounter for fracture: Secondary | ICD-10-CM | POA: Diagnosis not present

## 2022-03-24 DIAGNOSIS — S83242D Other tear of medial meniscus, current injury, left knee, subsequent encounter: Secondary | ICD-10-CM | POA: Diagnosis not present

## 2022-03-24 DIAGNOSIS — M545 Low back pain, unspecified: Secondary | ICD-10-CM | POA: Diagnosis not present

## 2022-03-24 DIAGNOSIS — M25562 Pain in left knee: Secondary | ICD-10-CM | POA: Diagnosis not present

## 2022-04-13 DIAGNOSIS — M5416 Radiculopathy, lumbar region: Secondary | ICD-10-CM | POA: Diagnosis not present

## 2022-04-28 DIAGNOSIS — N950 Postmenopausal bleeding: Secondary | ICD-10-CM | POA: Diagnosis not present

## 2022-04-28 DIAGNOSIS — R3989 Other symptoms and signs involving the genitourinary system: Secondary | ICD-10-CM | POA: Diagnosis not present

## 2022-04-28 DIAGNOSIS — N840 Polyp of corpus uteri: Secondary | ICD-10-CM | POA: Diagnosis not present

## 2022-05-03 DIAGNOSIS — N841 Polyp of cervix uteri: Secondary | ICD-10-CM

## 2022-06-21 DIAGNOSIS — N841 Polyp of cervix uteri: Secondary | ICD-10-CM

## 2022-07-18 DIAGNOSIS — L570 Actinic keratosis: Secondary | ICD-10-CM | POA: Diagnosis not present

## 2022-07-18 DIAGNOSIS — C44612 Basal cell carcinoma of skin of right upper limb, including shoulder: Secondary | ICD-10-CM | POA: Diagnosis not present

## 2022-07-18 DIAGNOSIS — L821 Other seborrheic keratosis: Secondary | ICD-10-CM | POA: Diagnosis not present

## 2022-07-18 DIAGNOSIS — D492 Neoplasm of unspecified behavior of bone, soft tissue, and skin: Secondary | ICD-10-CM | POA: Diagnosis not present

## 2022-08-16 ENCOUNTER — Encounter (HOSPITAL_BASED_OUTPATIENT_CLINIC_OR_DEPARTMENT_OTHER): Admission: RE | Payer: Self-pay | Source: Ambulatory Visit

## 2022-08-16 ENCOUNTER — Ambulatory Visit (HOSPITAL_BASED_OUTPATIENT_CLINIC_OR_DEPARTMENT_OTHER)
Admission: RE | Admit: 2022-08-16 | Payer: BC Managed Care – PPO | Source: Ambulatory Visit | Admitting: Obstetrics and Gynecology

## 2022-08-16 DIAGNOSIS — N841 Polyp of cervix uteri: Secondary | ICD-10-CM

## 2022-08-16 SURGERY — DILATATION & CURETTAGE/HYSTEROSCOPY WITH MYOSURE
Anesthesia: Choice

## 2022-08-21 DIAGNOSIS — G43909 Migraine, unspecified, not intractable, without status migrainosus: Secondary | ICD-10-CM | POA: Diagnosis not present

## 2022-08-21 DIAGNOSIS — F411 Generalized anxiety disorder: Secondary | ICD-10-CM | POA: Diagnosis not present

## 2022-08-21 DIAGNOSIS — J302 Other seasonal allergic rhinitis: Secondary | ICD-10-CM | POA: Diagnosis not present

## 2022-08-21 DIAGNOSIS — J988 Other specified respiratory disorders: Secondary | ICD-10-CM | POA: Diagnosis not present

## 2022-08-28 ENCOUNTER — Other Ambulatory Visit: Payer: Self-pay | Admitting: Family Medicine

## 2022-08-28 DIAGNOSIS — R5381 Other malaise: Secondary | ICD-10-CM

## 2022-09-06 ENCOUNTER — Other Ambulatory Visit: Payer: Self-pay | Admitting: Family Medicine

## 2022-09-06 DIAGNOSIS — E2839 Other primary ovarian failure: Secondary | ICD-10-CM

## 2022-11-21 DIAGNOSIS — D492 Neoplasm of unspecified behavior of bone, soft tissue, and skin: Secondary | ICD-10-CM | POA: Diagnosis not present

## 2022-11-21 DIAGNOSIS — L814 Other melanin hyperpigmentation: Secondary | ICD-10-CM | POA: Diagnosis not present

## 2022-11-21 DIAGNOSIS — L821 Other seborrheic keratosis: Secondary | ICD-10-CM | POA: Diagnosis not present

## 2022-11-21 DIAGNOSIS — D225 Melanocytic nevi of trunk: Secondary | ICD-10-CM | POA: Diagnosis not present

## 2022-11-21 DIAGNOSIS — Z08 Encounter for follow-up examination after completed treatment for malignant neoplasm: Secondary | ICD-10-CM | POA: Diagnosis not present

## 2022-11-21 DIAGNOSIS — L570 Actinic keratosis: Secondary | ICD-10-CM | POA: Diagnosis not present

## 2022-11-28 DIAGNOSIS — S83242D Other tear of medial meniscus, current injury, left knee, subsequent encounter: Secondary | ICD-10-CM | POA: Diagnosis not present

## 2022-11-28 DIAGNOSIS — M5126 Other intervertebral disc displacement, lumbar region: Secondary | ICD-10-CM | POA: Diagnosis not present

## 2022-11-28 DIAGNOSIS — M84351A Stress fracture, right femur, initial encounter for fracture: Secondary | ICD-10-CM | POA: Diagnosis not present

## 2022-11-28 DIAGNOSIS — M48061 Spinal stenosis, lumbar region without neurogenic claudication: Secondary | ICD-10-CM | POA: Diagnosis not present

## 2022-12-01 DIAGNOSIS — S83242D Other tear of medial meniscus, current injury, left knee, subsequent encounter: Secondary | ICD-10-CM | POA: Diagnosis not present

## 2022-12-07 DIAGNOSIS — M5416 Radiculopathy, lumbar region: Secondary | ICD-10-CM | POA: Diagnosis not present

## 2022-12-08 HISTORY — PX: KNEE SURGERY: SHX244

## 2022-12-18 DIAGNOSIS — M87252 Osteonecrosis due to previous trauma, left femur: Secondary | ICD-10-CM | POA: Diagnosis not present

## 2022-12-18 DIAGNOSIS — X58XXXA Exposure to other specified factors, initial encounter: Secondary | ICD-10-CM | POA: Diagnosis not present

## 2022-12-18 DIAGNOSIS — M2341 Loose body in knee, right knee: Secondary | ICD-10-CM | POA: Diagnosis not present

## 2022-12-18 DIAGNOSIS — M659 Synovitis and tenosynovitis, unspecified: Secondary | ICD-10-CM | POA: Diagnosis not present

## 2022-12-18 DIAGNOSIS — M87250 Osteonecrosis due to previous trauma, pelvis: Secondary | ICD-10-CM | POA: Diagnosis not present

## 2022-12-18 DIAGNOSIS — M87852 Other osteonecrosis, left femur: Secondary | ICD-10-CM | POA: Diagnosis not present

## 2022-12-18 DIAGNOSIS — S83232A Complex tear of medial meniscus, current injury, left knee, initial encounter: Secondary | ICD-10-CM | POA: Diagnosis not present

## 2022-12-18 DIAGNOSIS — Y999 Unspecified external cause status: Secondary | ICD-10-CM | POA: Diagnosis not present

## 2022-12-18 DIAGNOSIS — M94262 Chondromalacia, left knee: Secondary | ICD-10-CM | POA: Diagnosis not present

## 2022-12-18 DIAGNOSIS — M170 Bilateral primary osteoarthritis of knee: Secondary | ICD-10-CM | POA: Diagnosis not present

## 2022-12-18 DIAGNOSIS — G8918 Other acute postprocedural pain: Secondary | ICD-10-CM | POA: Diagnosis not present

## 2023-01-17 DIAGNOSIS — Z780 Asymptomatic menopausal state: Secondary | ICD-10-CM | POA: Diagnosis not present

## 2023-01-30 DIAGNOSIS — Z4889 Encounter for other specified surgical aftercare: Secondary | ICD-10-CM | POA: Diagnosis not present

## 2023-01-30 DIAGNOSIS — M545 Low back pain, unspecified: Secondary | ICD-10-CM | POA: Diagnosis not present

## 2023-01-30 DIAGNOSIS — M25561 Pain in right knee: Secondary | ICD-10-CM | POA: Diagnosis not present

## 2023-02-05 NOTE — Therapy (Signed)
OUTPATIENT PHYSICAL THERAPY LOWER EXTREMITY EVALUATION   Patient Name: Miasia Headman MRN: [id number] DOB:05/30/1966, 57 y.o., female Today's Date: 02/06/2023  END OF SESSION:  PT End of Session - 02/06/23 1235     Visit Number 1    Number of Visits --    Date for PT Re-Evaluation 04/03/23    Authorization Type BCBS    PT Start Time 1235    PT Stop Time 1328    PT Time Calculation (min) 53 min    Activity Tolerance Patient tolerated treatment well    Behavior During Therapy [facility] for tasks assessed/performed             Past Medical History:  Diagnosis Date   Anxiety    Migraine    Migraines    Past Surgical History:  Procedure Laterality Date   GANGLION CYST EXCISION  age 21   vocal chord polyps     Patient Active Problem List   Diagnosis Date Noted   Headache 03/10/2014   Dizziness 03/10/2014    PCP: Shirlean Mylar, MD   REFERRING PROVIDER: Jene Every, MD   REFERRING DIAG: Z48.89 Encounter for other specified surgical aftercare  THERAPY DIAG:  Acute pain of left knee  Stiffness of left knee, not elsewhere classified  Acute pain of right knee  Radiculopathy, lumbar region  Cramp and spasm  Rationale for Evaluation and Treatment: Rehabilitation  ONSET DATE: 12/18/22 DOS  SUBJECTIVE:   SUBJECTIVE STATEMENT: Patient had surgery for L microfracture of medial femoral condyle on 12/18/22. Also had bone fragment and meniscal tear on L. She was cleared to start walking last Tuesday. NWB for first 6 weeks. It still hurts intermittently. Bike helps a lot for loosening. Prior to knee stuff she has had very bad sciatica on L side (see history below). Standing 5 min or more sx down left leg, both feet get numb. Was managing with exercises, but using the walker etc has aggravated her back. Her R knee started hurting about 01/30/23. MRI this weekend.   PERTINENT HISTORY: See below  X-rays of the knees demonstrate no fragmentation of the medial femoral  condyle. Bilateral mild medial joint space narrowing.  Impression:  1. Status post left knee medial femoral condyle microfracture 6 weeks status post 2. Possible now meniscal tear of the right knee with locking and giving way 3. Bilateral lower extremity radicular pain history of disc protrusion L4-5 L5-S1 relief with an epidural L5-S1 on the right L4-5 on the left  Plan:  I discussed obtaining MRI of the right knee to evaluate for meniscus tear which may require partial meniscectomy.  For the left knee physical therapy progressive strengthening progressive full weightbearing. Utilization of the brace. Call if any significant pain   PAIN:  Are you having pain? Yes: NPRS scale: 2 up to 6/10 Pain location: L knee  Pain description: stiffness Aggravating factors: morning stiffiness or after sitting Relieving factors: moving  Are you having pain? Yes: NPRS scale: 0 today up to 3-4/10 Pain location: R knee Pain description: intermittent, less popping Aggravating factors: gait issue from surgery Relieving factors: unsure  Are you having pain? Yes: NPRS scale: 3 in back to 7 in LLE with standing/10 Pain location: LBP into L LE Pain description: burning Aggravating factors: standing x 5 min Relieving factors: sitting  PRECAUTIONS: Knee  WEIGHT BEARING RESTRICTIONS: No  FALLS:  Has patient fallen in last 6 months? Yes. 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  NEXT MD VISIT: 02/16/23  OBJECTIVE:   DIAGNOSTIC FINDINGS:  X-rays of the knees demonstrate no fragmentation of the medial femoral condyle. Bilateral mild medial  joint space narrowing.  Impression:  1. Status post left knee medial femoral condyle microfracture 6 weeks status post 2. Possible now meniscal tear of the right knee with locking and giving way 3. Bilateral lower extremity radicular pain history of disc protrusion L4-5 L5-S1 relief with an epidural L5-S1 on the right L4-5 on the left  Plan:  I discussed obtaining MRI of the right knee to evaluate for meniscus tear which may require partial meniscectomy.  For the left knee physical therapy progressive strengthening progressive full weightbearing. Utilization of the brace. Call if any significant pain  PATIENT SURVEYS:  Modified Oswestry TBD  LEFS TBD  COGNITION: Overall cognitive status: Within functional limits for tasks assessed     SENSATION: WFL  MUSCLE LENGTH: HS:WNL Quads: mild tightness  ITB: Piriformis: WNL Hip Flexors: right tight, mild left  POSTURE: No Significant postural limitations  PALPATION: Increased tissue tension L gluteals; some tenderness at incision on L patella  LUMBAR  AND LOWER EXTREMITY ROM:WNL except where noted:   Active ROM Right eval Left eval  Knee flexion 146 129  Knee extension 0 0   (Blank rows = not tested)   LOWER EXTREMITY MMT: L KNEE not tested; else 5/5 LE strength except R hip flex 4+/5   GAIT: WNL  TODAY'S TREATMENT:                                                                                                                              DATE:   02/06/23 See pt ed and HEP  PATIENT EDUCATION:  Education details: PT eval findings, anticipated POC, and initial HEP  Person educated: Patient Education method: Explanation, Demonstration, and Handouts Education comprehension: verbalized understanding and returned demonstration  HOME EXERCISE PROGRAM: Access Code: [email or web address] URL: [email or web address] Date: 02/06/2023 Prepared by: Raynelle Fanning  Exercises - Seated Hip Flexor Stretch  - 2 x daily - 7 x weekly -  1 sets - 3 reps - 30-60 sec hold - Active Hip Flexor Stretch  - 2 x daily - 7 x weekly - 1 sets - 3 reps - 30 sec hold - Supine Active Straight Leg Raise  - 2 x daily - 7 x weekly - 3 sets - 10 reps  ASSESSMENT:  CLINICAL IMPRESSION: Patient is a 57 y.o. female who was seen today for physical therapy evaluation and treatment for left knee pain s/p L medial femoral codyle microfracture surgery on 12/18/22. She is 7 weeks post op and FWB  at this time. She has limitations in L knee flexion compared to R and intermittent pain with walking. Mild edema noted.  In addition, patient reports R knee pain and possible meniscus tear beginning 01/30/23. MRI is Saturday. She also has L lumbar radiculopathy and bil numbness in her feet with standing over 5 min which has been happening intermittently for years, but has worsened post surgery due to using the walker and WB restrictions. She is generally strong but has functional LE weakness and some unsteadiness secondary to surgery. She is limited with ADLS due to surgical precautions. She will benefit from skilled PT to address these deficits.    OBJECTIVE IMPAIRMENTS: decreased activity tolerance, decreased balance, difficulty walking, decreased ROM, decreased strength, increased muscle spasms, impaired flexibility, and pain.   ACTIVITY LIMITATIONS: standing, squatting, and locomotion level  PARTICIPATION LIMITATIONS:  limited by pain and surgical restrictions  PERSONAL FACTORS: 1-2 comorbidities: R knee pain and lumbar radiculopathy  are also affecting patient's functional outcome.   REHAB POTENTIAL: Excellent  CLINICAL DECISION MAKING: Evolving/moderate complexity  EVALUATION COMPLEXITY: Moderate   GOALS: Goals reviewed with patient? Yes  SHORT TERM GOALS: Target date: 02/20/23 Ind with initial HEP Baseline: Goal status: INITIAL   LONG TERM GOALS: Target date: 04/03/23  Ind with advanced HEP Baseline:  Goal status: INITIAL  2.  Improved L knee  flexion to >=135 to normalize squats and ADLS Baseline:  Goal status: INITIAL  3.  Improved L knee strength to 5/5 to normalize ADLS Baseline:  Goal status: INITIAL  4.  Decreased Bil knee and left radicular pain by 75% or more to improve QOL Baseline:  Goal status: INITIAL  5.  Improved standing tolerance to 15 min without increased left radicular pain. Baseline:  Goal status: INITIAL  6.  Improved LEFS score by 9 points showing functional improvement. Baseline:  Goal status: INITIAL   PLAN:  PT FREQUENCY: 1-2x/week  PT DURATION: 8 weeks  PLANNED INTERVENTIONS: Therapeutic exercises, Therapeutic activity, Neuromuscular re-education, Balance training, Gait training, Patient/Family education, Self Care, Joint mobilization, Stair training, Aquatic Therapy, Dry Needling, Electrical stimulation, Spinal mobilization, Cryotherapy, Moist heat, Taping, Vasopneumatic device, Traction, Ionotophoresis 4mg /ml Dexamethasone, and Manual therapy  PLAN FOR NEXT SESSION: complete LEFS and Mod Oswestry, progress LE strengthening as tolerated starting with heel/toe raise and (front lunges, front/lat step ups, wall squats to 45 deg with UE assist); for back - core strength/lumbar stab; possible DN to L gluteals, try extension biased TE.   Estoria Geary, PT 02/06/2023, 1:41 PM

## 2023-02-06 ENCOUNTER — Ambulatory Visit: Payer: BC Managed Care – PPO | Attending: Specialist | Admitting: Physical Therapy

## 2023-02-06 ENCOUNTER — Other Ambulatory Visit: Payer: Self-pay

## 2023-02-06 ENCOUNTER — Encounter: Payer: Self-pay | Admitting: Physical Therapy

## 2023-02-06 DIAGNOSIS — M25662 Stiffness of left knee, not elsewhere classified: Secondary | ICD-10-CM

## 2023-02-06 DIAGNOSIS — M25561 Pain in right knee: Secondary | ICD-10-CM

## 2023-02-06 DIAGNOSIS — M25562 Pain in left knee: Secondary | ICD-10-CM | POA: Diagnosis not present

## 2023-02-06 DIAGNOSIS — M5416 Radiculopathy, lumbar region: Secondary | ICD-10-CM | POA: Diagnosis not present

## 2023-02-06 DIAGNOSIS — R252 Cramp and spasm: Secondary | ICD-10-CM

## 2023-02-08 DIAGNOSIS — M5416 Radiculopathy, lumbar region: Secondary | ICD-10-CM | POA: Diagnosis not present

## 2023-02-10 DIAGNOSIS — M25561 Pain in right knee: Secondary | ICD-10-CM | POA: Diagnosis not present

## 2023-02-15 DIAGNOSIS — C44529 Squamous cell carcinoma of skin of other part of trunk: Secondary | ICD-10-CM | POA: Diagnosis not present

## 2023-02-15 DIAGNOSIS — L570 Actinic keratosis: Secondary | ICD-10-CM | POA: Diagnosis not present

## 2023-02-16 DIAGNOSIS — M25561 Pain in right knee: Secondary | ICD-10-CM | POA: Diagnosis not present

## 2023-02-19 ENCOUNTER — Ambulatory Visit: Payer: BC Managed Care – PPO | Attending: Specialist | Admitting: Physical Therapy

## 2023-02-19 DIAGNOSIS — M5416 Radiculopathy, lumbar region: Secondary | ICD-10-CM

## 2023-02-19 DIAGNOSIS — M25562 Pain in left knee: Secondary | ICD-10-CM | POA: Diagnosis not present

## 2023-02-19 DIAGNOSIS — M25561 Pain in right knee: Secondary | ICD-10-CM | POA: Diagnosis not present

## 2023-02-19 DIAGNOSIS — R252 Cramp and spasm: Secondary | ICD-10-CM | POA: Diagnosis not present

## 2023-02-19 DIAGNOSIS — M25662 Stiffness of left knee, not elsewhere classified: Secondary | ICD-10-CM | POA: Insufficient documentation

## 2023-02-19 NOTE — Therapy (Addendum)
OUTPATIENT PHYSICAL THERAPY LOWER EXTREMITY TREATMENT   Patient Name: Catherine Hartman MRN: [id number] DOB:04-Jan-1966, 57 y.o., female Today's Date: 02/19/2023  END OF SESSION:  PT End of Session - 02/19/23 1145     Visit Number 2    Date for PT Re-Evaluation 04/03/23    Authorization Type BCBS    PT Start Time 1145    PT Stop Time 1230    PT Time Calculation (min) 45 min    Activity Tolerance Patient tolerated treatment well    Behavior During Therapy [facility] for tasks assessed/performed             Past Medical History:  Diagnosis Date   Anxiety    Migraine    Migraines    Past Surgical History:  Procedure Laterality Date   GANGLION CYST EXCISION  age 73   vocal chord polyps     Patient Active Problem List   Diagnosis Date Noted   Headache 03/10/2014   Dizziness 03/10/2014    PCP: Shirlean Mylar, MD   REFERRING PROVIDER: Jene Every, MD   REFERRING DIAG: Z48.89 Encounter for other specified surgical aftercare  THERAPY DIAG:  Acute pain of left knee  Stiffness of left knee, not elsewhere classified  Acute pain of right knee  Radiculopathy, lumbar region  Cramp and spasm  Rationale for Evaluation and Treatment: Rehabilitation  ONSET DATE: 12/18/22 DOS  SUBJECTIVE:   SUBJECTIVE STATEMENT: Pt reports she's been cycling. L knee gets stiff with prolonged sitting. Saw ortho on Friday who has encouraged continued exercise. Has been exercising. Still not able to bend knees all the way. Pt states leg lifts have been aggravating her back  PERTINENT HISTORY: See below  X-rays of the knees demonstrate no fragmentation of the medial femoral condyle. Bilateral mild medial joint space narrowing.  Impression:  1. Status post left knee medial femoral condyle microfracture 6 weeks status post 2. Possible now meniscal tear of the right knee with locking and giving way 3. Bilateral lower extremity radicular pain history of disc protrusion L4-5 L5-S1  relief with an epidural L5-S1 on the right L4-5 on the left  Plan:  I discussed obtaining MRI of the right knee to evaluate for meniscus tear which may require partial meniscectomy.  For the left knee physical therapy progressive strengthening progressive full weightbearing. Utilization of the brace. Call if any significant pain   PAIN:  Are you having pain? Yes: NPRS scale: 2 up to 6/10 Pain location: L knee  Pain description: stiffness Aggravating factors: morning stiffiness or after sitting Relieving factors: moving  Are you having pain? Yes: NPRS scale: 0 today up to 3-4/10 Pain location: R knee Pain description: intermittent, less popping Aggravating factors: gait issue from surgery Relieving factors: unsure  Are you having pain? Yes: NPRS scale: 3 in back to 7 in LLE with standing/10 Pain location: LBP into L LE Pain description: burning Aggravating factors: standing x 5 min Relieving factors: sitting  PRECAUTIONS: Knee  WEIGHT BEARING RESTRICTIONS: No  FALLS:  Has patient fallen in last 6 months? Yes. 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  NEXT MD VISIT: 02/16/23  OBJECTIVE: (Measures in this section from initial evaluation unless otherwise noted)  DIAGNOSTIC FINDINGS:  X-rays of the knees demonstrate no fragmentation of the medial femoral condyle. Bilateral mild medial joint space narrowing.  Impression:  1. Status post left knee medial femoral condyle microfracture 6 weeks status post 2. Possible now meniscal tear of the right knee with locking and giving way 3. Bilateral lower extremity radicular pain  history of disc protrusion L4-5 L5-S1 relief with an epidural L5-S1 on the right L4-5 on the left  Plan:  I discussed obtaining MRI of the right knee to evaluate for meniscus tear which may require partial meniscectomy.  For the left knee physical therapy progressive strengthening progressive full weightbearing. Utilization of the brace. Call if any significant pain  PATIENT SURVEYS:  Modified Oswestry 24% (02/19/23)  LEFS 71.3% (02/19/23)  COGNITION: Overall cognitive status: Within functional limits for tasks assessed     SENSATION: WFL  MUSCLE LENGTH: HS:WNL Quads: mild tightness  ITB: Piriformis: WNL Hip Flexors: right tight, mild left  POSTURE: No Significant postural limitations  PALPATION: Increased tissue tension L gluteals; some tenderness at incision on L patella  LUMBAR  AND LOWER EXTREMITY ROM:WNL except where noted:   Active ROM Right eval Left eval  Knee flexion 146 129  Knee extension 0 0   (Blank rows = not tested)   LOWER EXTREMITY MMT: L KNEE not tested; else 5/5 LE strength except R hip flex 4+/5   GAIT: WNL  TODAY'S TREATMENT:        02/19/23 Nustep L7 x 5 min Seated hip flexor stretch x 30 sec Prone quad stretch with strap 2x30 sec Prone hamstring curl green TB 2x10 Supine hamstring stretch with strap 2x30 sec Sitting LAQ green TB 3x10 Standing hip abd green TB 2x10 Standing hip ext green TB 2x10                                                                                                                         DATE:   02/06/23 See pt ed and HEP  PATIENT EDUCATION:  Education details: PT eval findings, anticipated POC, and initial HEP  Person educated: Patient Education method: Explanation, Demonstration, and Handouts Education comprehension: verbalized understanding and returned demonstration  HOME EXERCISE PROGRAM: Access Code: [email or web address] URL: [email or web address] Date: 02/06/2023 Prepared by:  Raynelle Fanning  Exercises - Seated Hip Flexor Stretch  - 2 x daily - 7 x weekly - 1 sets - 3 reps - 30-60 sec hold - Active Hip Flexor Stretch  - 2 x daily - 7 x weekly - 1 sets - 3 reps - 30 sec hold - Supine Active Straight Leg Raise  - 2 x daily - 7 x weekly - 3 sets - 10 reps  ASSESSMENT:  CLINICAL IMPRESSION: Went over exercises to discuss modifications to decrease back pain. Initiated hip strengthening exercises today. Pt with improved knee flexion compared to eval.   OBJECTIVE IMPAIRMENTS: decreased activity tolerance, decreased balance, difficulty walking, decreased ROM,  decreased strength, increased muscle spasms, impaired flexibility, and pain.    GOALS: Goals reviewed with patient? Yes  SHORT TERM GOALS: Target date: 02/20/23 Ind with initial HEP Baseline: Goal status: INITIAL   LONG TERM GOALS: Target date: 04/03/23  Ind with advanced HEP Baseline:  Goal status: INITIAL  2.  Improved L knee flexion to >=135 to normalize squats and ADLS Baseline:  Goal status: INITIAL  3.  Improved L knee strength to 5/5 to normalize ADLS Baseline:  Goal status: INITIAL  4.  Decreased Bil knee and left radicular pain by 75% or more to improve QOL Baseline:  Goal status: INITIAL  5.  Improved standing tolerance to 15 min without increased left radicular pain. Baseline:  Goal status: INITIAL  6.  Improved LEFS score by 9 points showing functional improvement. Baseline:  Goal status: INITIAL   PLAN:  PT FREQUENCY: 1-2x/week  PT DURATION: 8 weeks  PLANNED INTERVENTIONS: Therapeutic exercises, Therapeutic activity, Neuromuscular re-education, Balance training, Gait training, Patient/Family education, Self Care, Joint mobilization, Stair training, Aquatic Therapy, Dry Needling, Electrical stimulation, Spinal mobilization, Cryotherapy, Moist heat, Taping, Vasopneumatic device, Traction, Ionotophoresis 4mg /ml Dexamethasone, and Manual therapy  PLAN FOR NEXT SESSION: complete LEFS  and Mod Oswestry, progress LE strengthening as tolerated starting with heel/toe raise and (front lunges, front/lat step ups, wall squats to 45 deg with UE assist); for back - core strength/lumbar stab; possible DN to L gluteals, try extension biased TE.   Jariyah Hackley April Ma L Eugine Bubb, PT 02/19/2023, 11:45 AM  PHYSICAL THERAPY DISCHARGE SUMMARY  Visits from Start of Care: 2  Current functional level related to goals / functional outcomes: See above for most current PT status.  Pt didn't return to PT.    Remaining deficits: See above.    Education / Equipment: HEP   Patient agrees to discharge. Patient goals were not met. Patient is being discharged due to not returning since the last visit.  Lorrene Reid, PT 11/08/23 3:26 PM

## 2023-02-23 ENCOUNTER — Other Ambulatory Visit: Payer: BC Managed Care – PPO

## 2023-02-26 ENCOUNTER — Ambulatory Visit: Payer: BC Managed Care – PPO | Admitting: Physical Therapy

## 2023-03-06 DIAGNOSIS — E559 Vitamin D deficiency, unspecified: Secondary | ICD-10-CM | POA: Diagnosis not present

## 2023-03-06 DIAGNOSIS — F411 Generalized anxiety disorder: Secondary | ICD-10-CM | POA: Diagnosis not present

## 2023-03-06 DIAGNOSIS — Z13220 Encounter for screening for lipoid disorders: Secondary | ICD-10-CM | POA: Diagnosis not present

## 2023-03-06 DIAGNOSIS — E538 Deficiency of other specified B group vitamins: Secondary | ICD-10-CM | POA: Diagnosis not present

## 2023-03-06 DIAGNOSIS — F330 Major depressive disorder, recurrent, mild: Secondary | ICD-10-CM | POA: Diagnosis not present

## 2023-03-06 DIAGNOSIS — Z0000 Encounter for general adult medical examination without abnormal findings: Secondary | ICD-10-CM | POA: Diagnosis not present

## 2023-03-06 DIAGNOSIS — R5383 Other fatigue: Secondary | ICD-10-CM | POA: Diagnosis not present

## 2023-03-07 ENCOUNTER — Ambulatory Visit: Payer: BC Managed Care – PPO | Admitting: Physical Therapy

## 2023-03-12 ENCOUNTER — Ambulatory Visit: Payer: BC Managed Care – PPO | Admitting: Physical Therapy

## 2023-03-19 ENCOUNTER — Ambulatory Visit: Payer: BC Managed Care – PPO | Admitting: Physical Therapy

## 2023-03-20 DIAGNOSIS — M25562 Pain in left knee: Secondary | ICD-10-CM | POA: Diagnosis not present

## 2023-03-26 ENCOUNTER — Ambulatory Visit: Payer: BC Managed Care – PPO | Admitting: Physical Therapy

## 2023-03-27 DIAGNOSIS — L821 Other seborrheic keratosis: Secondary | ICD-10-CM | POA: Diagnosis not present

## 2023-03-27 DIAGNOSIS — L814 Other melanin hyperpigmentation: Secondary | ICD-10-CM | POA: Diagnosis not present

## 2023-03-27 DIAGNOSIS — Z08 Encounter for follow-up examination after completed treatment for malignant neoplasm: Secondary | ICD-10-CM | POA: Diagnosis not present

## 2023-03-27 DIAGNOSIS — C44529 Squamous cell carcinoma of skin of other part of trunk: Secondary | ICD-10-CM | POA: Diagnosis not present

## 2023-03-27 DIAGNOSIS — L570 Actinic keratosis: Secondary | ICD-10-CM | POA: Diagnosis not present

## 2023-03-27 DIAGNOSIS — D225 Melanocytic nevi of trunk: Secondary | ICD-10-CM | POA: Diagnosis not present

## 2023-04-02 ENCOUNTER — Encounter: Payer: BC Managed Care – PPO | Admitting: Physical Therapy

## 2023-04-17 DIAGNOSIS — M5116 Intervertebral disc disorders with radiculopathy, lumbar region: Secondary | ICD-10-CM | POA: Diagnosis not present

## 2023-04-17 DIAGNOSIS — M9903 Segmental and somatic dysfunction of lumbar region: Secondary | ICD-10-CM | POA: Diagnosis not present

## 2023-04-17 DIAGNOSIS — M9904 Segmental and somatic dysfunction of sacral region: Secondary | ICD-10-CM | POA: Diagnosis not present

## 2023-04-17 DIAGNOSIS — M5117 Intervertebral disc disorders with radiculopathy, lumbosacral region: Secondary | ICD-10-CM | POA: Diagnosis not present

## 2023-07-06 DIAGNOSIS — Z124 Encounter for screening for malignant neoplasm of cervix: Secondary | ICD-10-CM | POA: Diagnosis not present

## 2023-07-06 DIAGNOSIS — Z79890 Hormone replacement therapy (postmenopausal): Secondary | ICD-10-CM | POA: Diagnosis not present

## 2023-07-06 DIAGNOSIS — Z01419 Encounter for gynecological examination (general) (routine) without abnormal findings: Secondary | ICD-10-CM | POA: Diagnosis not present

## 2023-07-06 DIAGNOSIS — N841 Polyp of cervix uteri: Secondary | ICD-10-CM | POA: Diagnosis not present

## 2023-07-06 DIAGNOSIS — N951 Menopausal and female climacteric states: Secondary | ICD-10-CM | POA: Diagnosis not present

## 2023-09-25 DIAGNOSIS — C44519 Basal cell carcinoma of skin of other part of trunk: Secondary | ICD-10-CM | POA: Diagnosis not present

## 2023-09-25 DIAGNOSIS — D485 Neoplasm of uncertain behavior of skin: Secondary | ICD-10-CM | POA: Diagnosis not present

## 2023-09-25 DIAGNOSIS — R208 Other disturbances of skin sensation: Secondary | ICD-10-CM | POA: Diagnosis not present

## 2023-09-25 DIAGNOSIS — L820 Inflamed seborrheic keratosis: Secondary | ICD-10-CM | POA: Diagnosis not present

## 2023-09-25 DIAGNOSIS — C44622 Squamous cell carcinoma of skin of right upper limb, including shoulder: Secondary | ICD-10-CM | POA: Diagnosis not present

## 2023-09-25 DIAGNOSIS — L538 Other specified erythematous conditions: Secondary | ICD-10-CM | POA: Diagnosis not present

## 2023-09-26 DIAGNOSIS — F411 Generalized anxiety disorder: Secondary | ICD-10-CM | POA: Diagnosis not present

## 2023-09-26 DIAGNOSIS — G43909 Migraine, unspecified, not intractable, without status migrainosus: Secondary | ICD-10-CM | POA: Diagnosis not present

## 2023-10-16 DIAGNOSIS — M5459 Other low back pain: Secondary | ICD-10-CM | POA: Diagnosis not present

## 2023-11-12 DIAGNOSIS — M545 Low back pain, unspecified: Secondary | ICD-10-CM | POA: Diagnosis not present

## 2024-01-04 DIAGNOSIS — L910 Hypertrophic scar: Secondary | ICD-10-CM | POA: Diagnosis not present

## 2024-01-04 DIAGNOSIS — C44519 Basal cell carcinoma of skin of other part of trunk: Secondary | ICD-10-CM | POA: Diagnosis not present

## 2024-01-04 DIAGNOSIS — L905 Scar conditions and fibrosis of skin: Secondary | ICD-10-CM | POA: Diagnosis not present

## 2024-01-04 DIAGNOSIS — C44622 Squamous cell carcinoma of skin of right upper limb, including shoulder: Secondary | ICD-10-CM | POA: Diagnosis not present

## 2024-01-21 ENCOUNTER — Encounter: Payer: Self-pay | Admitting: Neurology

## 2024-01-21 ENCOUNTER — Ambulatory Visit (INDEPENDENT_AMBULATORY_CARE_PROVIDER_SITE_OTHER): Payer: Self-pay | Admitting: Neurology

## 2024-01-21 VITALS — BP 110/71 | HR 71 | Ht 62.0 in | Wt 105.8 lb

## 2024-01-21 DIAGNOSIS — G43709 Chronic migraine without aura, not intractable, without status migrainosus: Secondary | ICD-10-CM | POA: Diagnosis not present

## 2024-01-21 MED ORDER — EMGALITY 120 MG/ML ~~LOC~~ SOAJ: 120.0000 mg | SUBCUTANEOUS | 11 refills | Status: DC

## 2024-01-21 MED ORDER — EMGALITY 120 MG/ML ~~LOC~~ SOAJ: 240.0000 mg | Freq: Once | SUBCUTANEOUS | 11 refills | Status: DC

## 2024-01-21 NOTE — Progress Notes (Addendum)
 [facility]    Provider:  Dr Ines Requesting Provider: Dyane Anthony RAMAN, FNP Primary Care Provider:  Douglass Ivanoff, MD  CC:  chronic migraines  04/30/2024: Emgality  with significant side effects. The cgrp medications are contraindicated(aimovig,ajovy,emgality ). Patient still with daily headaches, > 15 total moderate to severe migraines days a month, initiate botox approval  HPI:  Catherine Hartman is a 58 y.o. female here as requested by Dyane Anthony RAMAN, FNP for migraines. has Headache; Dizziness; and Chronic migraine without aura without status migrainosus, not intractable on their problem list.  Patient reports she started having headaches at the age of 53 which were severe, responded to biofeedback, at age 76 she started having headaches over the frontal and occipital region, more unilateral on the left side of her face, with associated nausea, photophobia and phonophobia, hurts to move, vomiting, knifelike pulsating and pounding, she saw neurology in 2015 for dizziness and blurry vision and MRI at that time was normal, they are exacerbated by looking at the computer with blurry vision, Imitrex helps.  She also has associated neck tension.  No family history of migraines.  No aura. Has been worsening. She can't get through a day without a headache, daily headaches, > 15 total moderate to severe migraines days a month, she has new tinnitus/ringin gin the ear, dizziness, blurry vision, she can't even drive anymore (she did report dizziness, loss of focus, imbalance in 2015 and MRI was normal) but reports new tinnitus (advised to go to ENT and audiology). Disrupting her life, she can't drive anymore, migraines are moderate to severe, can last 4-24 hours, she can wake up with a headache and it can be positional. Worsening, daily, she has been taking too much imitrex and stopped she examined her lifestyle, no aura and no medication overuse.   Reviewed notes, labs and imaging  from outside physicians, which showed:  Medications tried greater than 3 months that can be used in migraine management include: Imitrex, Phenergan , prednisone , Zofran , amitriptyline, she has also tried a calcium channel blocker with side effect, she examined her lifestyle and medication use (no medication oversue), topiramate(saw another neurologist), verapamil, blood pressure medications contraindicated due to hypotension, nurtec helped initially,   From a review of records, patient saw Dr. Georjean in urology in 2015, for daily headaches and dizziness, MRI of the brain was normal, she started having severe headaches at the age of 60, had biofeedback with good results, at the age of 87 when she started having headaches over the frontal and occipital regions, she would have them in the left side of her face, like a knife in her brain, severe nausea photo and phonophobia, vomiting, imitrex, zofran , aimovig contraindicated due to constipation  03/25/2014: CLINICAL DATA:  Headache and dizziness   EXAM:  MRI HEAD WITHOUT AND WITH CONTRAST   TECHNIQUE:  Multiplanar, multiecho pulse sequences of the brain and surrounding  structures were obtained without and with intravenous contrast.   CONTRAST:  10mL MULTIHANCE  GADOBENATE DIMEGLUMINE  529 MG/ML IV SOLN   COMPARISON:  None.   FINDINGS:  Ventricle size is normal. Craniocervical junction normal. Pituitary  normal in size. Cerebral volume is normal.   Negative for acute or chronic infarction. Cerebral white matter is  normal. Brainstem is normal. Negative for demyelinating disease.   Negative for hemorrhage or mass lesion. No edema or shift of the  midline structures.   Postcontrast imaging reveals normal enhancement.  No enhancing mass.   Paranasal sinuses are clear.   IMPRESSION:  Normal      Latest Ref Rng & Units 08/05/2016    2:25 PM  CBC  WBC 4.0 - 10.5 K/uL 7.2   Hemoglobin 12.0 - 15.0 g/dL 86.1   Hematocrit 63.9 - 46.0 % 40.7    Platelets 150 - 400 K/uL 198       Latest Ref Rng & Units 08/05/2016    2:25 PM  CMP  Glucose 65 - 99 mg/dL 899   BUN 6 - 20 mg/dL 11   Creatinine 9.55 - 1.00 mg/dL 9.11   Sodium 864 - 854 mmol/L 140   Potassium 3.5 - 5.1 mmol/L 3.6   Chloride 101 - 111 mmol/L 108   CO2 22 - 32 mmol/L 25   Calcium 8.9 - 10.3 mg/dL 9.0   Total Protein 6.5 - 8.1 g/dL 6.8   Total Bilirubin 0.3 - 1.2 mg/dL 0.4   Alkaline Phos 38 - 126 U/L 43   AST 15 - 41 U/L 20   ALT 14 - 54 U/L 11      Review of Systems: Patient complains of symptoms per HPI as well as the following symptoms none. Pertinent negatives and positives per HPI. All others negative.   Social History   Socioeconomic History   Marital status: Single    Spouse name: Not on file   Number of children: Not on file   Years of education: Not on file   Highest education level: Not on file  Occupational History   Not on file  Tobacco Use   Smoking status: Former   Smokeless tobacco: Never  Substance and Sexual Activity   Alcohol use: Yes    Comment: occasional   Drug use: No   Sexual activity: Not on file  Other Topics Concern   Not on file  Social History Narrative   ** Merged History Encounter **       Social Drivers of Health   Financial Resource Strain: Not on file  Food Insecurity: Not on file  Transportation Needs: Not on file  Physical Activity: Not on file  Stress: Not on file  Social Connections: Unknown (02/21/2022)   Received from [facility]   Social Network    Social Network: Not on file  Intimate Partner Violence: Unknown (01/12/2022)   Received from [facility]   HITS    Physically Hurt: Not on file    Insult or Talk Down To: Not on file    Threaten Physical Harm: Not on file    Scream or Curse: Not on file    Family History  Problem Relation Age of Onset   Hypertension Father     Past Medical History:  Diagnosis Date   Anxiety    Migraine    Migraines      Patient Active Problem List   Diagnosis Date Noted   Chronic migraine without aura without status migrainosus, not intractable 01/21/2024   Headache 03/10/2014   Dizziness 03/10/2014    Past Surgical History:  Procedure Laterality Date   GANGLION CYST EXCISION  age 58   KNEE SURGERY Left 12/2022   vocal chord polyps      Current Outpatient Medications  Medication Sig Dispense Refill   clonazePAM (KLONOPIN) 0.5 MG tablet Take 0.25 mg by mouth at bedtime.     COMBIPATCH 0.05-0.14 MG/DAY Place 1 patch onto the skin 2 (two) times a week.     Multiple Vitamins-Minerals (MULTIVITAMIN WITH MINERALS) tablet Take 1 tablet by mouth daily.  SUMAtriptan (IMITREX) 100 MG tablet Take 25 mg by mouth every 2 (two) hours as needed for migraine.     Galcanezumab -gnlm (EMGALITY ) 120 MG/ML SOAJ Inject 120 mg into the skin every 30 (thirty) days. Emgality : 2 injections first month then one injection every subsequent month. Plese fill loading dose first. Plese run copay card: BIN: [id number] PCN PDMI GRP [id number] ID [id number] EXP 10/08/2024 1.12 mL 11   No current facility-administered medications for this visit.    Allergies as of 01/21/2024 - Review Complete 01/21/2024  Allergen Reaction Noted   Augmentin [amoxicillin-pot clavulanate] Other (See Comments) 08/05/2016   Other  10/07/2021    Vitals: BP 110/71   Pulse 71   Ht 5' 2 (1.575 m)   Wt 105 lb 12.8 oz (48 kg)   BMI 19.35 kg/m  Last Weight:  Wt Readings from Last 1 Encounters:  01/21/24 105 lb 12.8 oz (48 kg)   Last Height:   Ht Readings from Last 1 Encounters:  01/21/24 5' 2 (1.575 m)     Physical exam: Exam: Gen: NAD, conversant, well nourised, well groomed                     CV: RRR, no MRG. No Carotid Bruits. No peripheral edema, warm, nontender Eyes: Conjunctivae clear without exudates or hemorrhage  Neuro: Detailed Neurologic Exam  Speech:    Speech is normal; fluent and spontaneous with normal comprehension.   Cognition:    The patient is oriented to person, place, and time;     recent and remote memory intact;     language fluent;     normal attention, concentration,     fund of knowledge Cranial Nerves:    The pupils are equal, round, and reactive to light. The fundi are normal and spontaneous venous pulsations are present. Visual fields are full to finger confrontation. Extraocular movements are intact. Trigeminal sensation is intact and the muscles of mastication are normal. The face is symmetric. The palate elevates in the midline. Hearing intact. Voice is normal. Shoulder shrug is normal. The tongue has normal motion without fasciculations.   Coordination: nml  Gait: nml  Motor Observation:    No asymmetry, no atrophy, and no involuntary movements noted. Tone:    Normal muscle tone.    Posture:    Posture is normal. normal erect    Strength:    Strength is V/V in the upper and lower limbs.      Sensation: intact to LT     Reflex Exam:  DTR's:    Deep tendon reflexes in the upper and lower extremities are normal bilaterally.   Toes:    The toes are downgoing bilaterally.   Clonus:    Clonus is absent.    Assessment/Plan:  Patient with chronic migraines  04/30/2024: Emgality  with significant side effects. The cgrp medications are contraindicated(aimovig,ajovy,emgality ). Patient still with daily headaches, > 15 total moderate to severe migraines days a month, initiate botox approval  Discussed MRI of the brain, she prefers to wait on that currently  Discussed medication overuse  Emgality : 2 injections first month then one injection every subsequent month. Also Ajovy, vyepti, Qulipta as preventative. Acute management Ubrelvy or Nurtec  Start emgality    Continue Imitrex acutely do not take more than 2 days a week to avoid rebound    No orders of the defined types were placed in this encounter.  Meds ordered this encounter  Medications   DISCONTD:  Galcanezumab -gnlm (EMGALITY ) 120 MG/ML  SOAJ    Sig: Inject 240 mg into the skin once for 1 dose. Emgality : 2 injections first month then one injection every subsequent month. Plese fill this first. Plese run copay card: BIN: [id number] PCN PDMI GRP [id number] ID [id number] EXP 10/08/2024    Dispense:  1.12 mL    Refill:  11    Emgality : 2 injections first month then one injection every subsequent month. Plese fill this first. Plese run copay card: BIN: [id number] PCN PDMI GRP [id number] ID [id number] EXP 10/08/2024   Galcanezumab -gnlm (EMGALITY ) 120 MG/ML SOAJ    Sig: Inject 120 mg into the skin every 30 (thirty) days. Emgality : 2 injections first month then one injection every subsequent month. Plese fill loading dose first. Plese run copay card: BIN: [id number] PCN PDMI GRP [id number] ID [id number] EXP 10/08/2024    Dispense:  1.12 mL    Refill:  11    Emgality : 2 injections first month then one injection every subsequent month. Plese fill loading dose first. Plese run copay card: BIN: [id number] PCN PDMI GRP [id number] ID [id number] EXP 10/08/2024    Cc: Dyane Anthony RAMAN, FNP,  Douglass Ivanoff, MD  Onetha Epp, MD  [facility] [address] [place], [place] [address]  Phone [phone] Fax [phone]

## 2024-01-21 NOTE — Patient Instructions (Addendum)
 Emgality: 2 injections first month then one injection every subsequent month. Also Ajovy, vyepti, Qulipta as preventative. Acute management Ubrelvy or Nurtec. Also Botox.  Continue Imitrex acutely do not take more than 2 days a week to avoid rebound  Galcanezumab Injection What is this medication? GALCANEZUMAB (gal ka NEZ ue mab) prevents migraines. It works by blocking a substance in the body that causes migraines. It may also be used to treat cluster headaches. It is a monoclonal antibody. This medicine may be used for other purposes; ask your health care provider or pharmacist if you have questions. COMMON BRAND NAME(S): Emgality What should I tell my care team before I take this medication? They need to know if you have any of these conditions: An unusual or allergic reaction to galcanezumab, other medications, foods, dyes, or preservatives Pregnant or trying to get pregnant Breast-feeding How should I use this medication? This medication is injected under the skin. You will be taught how to prepare and give it. Take it as directed on the prescription label. Keep taking it unless your care team tells you to stop. It is important that you put your used needles and syringes in a special sharps container. Do not put them in a trash can. If you do not have a sharps container, call your pharmacist or care team to get one. Talk to your care team about the use of this medication in children. Special care may be needed. Overdosage: If you think you have taken too much of this medicine contact a poison control center or emergency room at once. NOTE: This medicine is only for you. Do not share this medicine with others. What if I miss a dose? If you miss a dose, take it as soon as you can. If it is almost time for your next dose, take only that dose. Do not take double or extra doses. What may interact with this medication? Interactions are not expected. This list may not describe all possible  interactions. Give your health care provider a list of all the medicines, herbs, non-prescription drugs, or dietary supplements you use. Also tell them if you smoke, drink alcohol, or use illegal drugs. Some items may interact with your medicine. What should I watch for while using this medication? Visit your care team for regular checks on your progress. Tell your care team if your symptoms do not start to get better or if they get worse. What side effects may I notice from receiving this medication? Side effects that you should report to your care team as soon as possible: Allergic reactions or angioedema--skin rash, itching or hives, swelling of the face, eyes, lips, tongue, arms, or legs, trouble swallowing or breathing Side effects that usually do not require medical attention (report to your care team if they continue or are bothersome): Pain, redness, or irritation at injection site This list may not describe all possible side effects. Call your doctor for medical advice about side effects. You may report side effects to FDA at 1-800-FDA-1088. Where should I keep my medication? Keep out of the reach of children and pets. Store in a refrigerator or at room temperature between 20 and 25 degrees C (68 and 77 degrees F). Refrigeration (preferred): Store in the refrigerator. Do not freeze. Keep in the original container until you are ready to take it. Remove the dose from the carton about 30 minutes before it is time for you to use it. If the dose is not used, it may be stored in  original container at room temperature for 7 days. Get rid of any unused medication after the expiration date. Room Temperature: This medication may be stored at room temperature for up to 7 days. Keep it in the original container. Protect from light until time of use. If it is stored at room temperature, get rid of any unused medication after 7 days or after it expires, whichever is first. To get rid of medications that are  no longer needed or have expired: Take the medication to a medication take-back program. Check with your pharmacy or law enforcement to find a location. If you cannot return the medication, ask your pharmacist or care team how to get rid of this medication safely. NOTE: This sheet is a summary. It may not cover all possible information. If you have questions about this medicine, talk to your doctor, pharmacist, or health care provider.  2024 Elsevier/Gold Standard (2021-11-21 00:00:00)

## 2024-01-21 NOTE — Addendum Note (Signed)
 Addended by: Jarod Bozzo B on: 01/21/2024 02:46 PM   Modules accepted: Orders, Level of Service

## 2024-01-24 ENCOUNTER — Telehealth: Payer: Self-pay

## 2024-01-24 ENCOUNTER — Other Ambulatory Visit (HOSPITAL_COMMUNITY): Payer: Self-pay

## 2024-01-24 NOTE — Telephone Encounter (Signed)
 Pharmacy Patient Advocate Encounter   Received notification from Fax that prior authorization for Emgality 120MG /ML auto-injectors (migraine) is required/requested.   Insurance verification completed.   The patient is insured through [facility] .   Per test claim: PA required; PA submitted to above mentioned insurance via CoverMyMeds Key/confirmation #/EOC [id number] Status is pending  loading dose.

## 2024-01-25 ENCOUNTER — Other Ambulatory Visit (HOSPITAL_COMMUNITY): Payer: Self-pay

## 2024-01-25 NOTE — Telephone Encounter (Signed)
 Pharmacy Patient Advocate Encounter  Received notification from [facility] that Prior Authorization for Emgality  120MG /ML auto-injectors (migraine) has been APPROVED from 01/24/2024 to 04/24/2024. Unable to obtain price due to refill too soon rejection, last fill date 01/21/2024 next available fill date5/02/2024   PA #/Case ID/Reference #: N/A

## 2024-02-29 DIAGNOSIS — M5416 Radiculopathy, lumbar region: Secondary | ICD-10-CM | POA: Diagnosis not present

## 2024-03-10 DIAGNOSIS — Z0000 Encounter for general adult medical examination without abnormal findings: Secondary | ICD-10-CM | POA: Diagnosis not present

## 2024-03-10 DIAGNOSIS — R42 Dizziness and giddiness: Secondary | ICD-10-CM | POA: Diagnosis not present

## 2024-03-10 DIAGNOSIS — G43909 Migraine, unspecified, not intractable, without status migrainosus: Secondary | ICD-10-CM | POA: Diagnosis not present

## 2024-03-10 DIAGNOSIS — F410 Panic disorder [episodic paroxysmal anxiety] without agoraphobia: Secondary | ICD-10-CM | POA: Diagnosis not present

## 2024-03-10 DIAGNOSIS — E569 Vitamin deficiency, unspecified: Secondary | ICD-10-CM | POA: Diagnosis not present

## 2024-03-10 DIAGNOSIS — F411 Generalized anxiety disorder: Secondary | ICD-10-CM | POA: Diagnosis not present

## 2024-03-10 DIAGNOSIS — E785 Hyperlipidemia, unspecified: Secondary | ICD-10-CM | POA: Diagnosis not present

## 2024-03-25 ENCOUNTER — Other Ambulatory Visit (HOSPITAL_COMMUNITY): Payer: Self-pay

## 2024-04-16 DIAGNOSIS — M48062 Spinal stenosis, lumbar region with neurogenic claudication: Secondary | ICD-10-CM | POA: Diagnosis not present

## 2024-04-16 DIAGNOSIS — M5416 Radiculopathy, lumbar region: Secondary | ICD-10-CM | POA: Diagnosis not present

## 2024-04-30 ENCOUNTER — Encounter: Payer: Self-pay | Admitting: Neurology

## 2024-05-01 ENCOUNTER — Telehealth: Payer: Self-pay | Admitting: *Deleted

## 2024-05-01 ENCOUNTER — Telehealth: Payer: Self-pay | Admitting: Neurology

## 2024-05-01 NOTE — Telephone Encounter (Signed)
-----   Message from Onetha KATHEE Epp sent at 04/30/2024  5:50 PM EDT ----- Regarding: G43.709. Please initiate botox for migraine approval G43.709. Please initiate botox for migraine approval

## 2024-05-01 NOTE — Telephone Encounter (Signed)
 Submitted benefit verification and enrolled in Botox Savings Program. [id number]

## 2024-05-07 NOTE — Telephone Encounter (Signed)
 Completed [employer] PA form and faxed to [phone].

## 2024-05-08 NOTE — Telephone Encounter (Signed)
 Spoke with patient and scheduled Botox injection for 06/17/24 at 11:30am with Amy and added appointment to wait list

## 2024-05-08 NOTE — Telephone Encounter (Signed)
 Received fax from [facility] that PA is not required, pt will be B/B. Reference # [id number]

## 2024-05-14 ENCOUNTER — Other Ambulatory Visit (HOSPITAL_COMMUNITY): Payer: Self-pay

## 2024-06-17 ENCOUNTER — Encounter: Payer: Self-pay | Admitting: Family Medicine

## 2024-06-17 ENCOUNTER — Ambulatory Visit (INDEPENDENT_AMBULATORY_CARE_PROVIDER_SITE_OTHER): Admitting: Family Medicine

## 2024-06-17 VITALS — BP 100/67 | HR 85

## 2024-06-17 DIAGNOSIS — G43709 Chronic migraine without aura, not intractable, without status migrainosus: Secondary | ICD-10-CM | POA: Diagnosis not present

## 2024-06-17 MED ORDER — ONABOTULINUMTOXINA 200 UNITS IJ SOLR
155.0000 [IU] | Freq: Once | INTRAMUSCULAR | Status: AC
  Administered 2024-06-17: 155 [IU] via INTRAMUSCULAR

## 2024-06-17 NOTE — Progress Notes (Signed)
 Botox - 200 units x 1 vial Lot: [id number] Expiration: 08/2026 NDC: 9976-6078-97  Bacteriostatic 0.9% Sodium Chloride- 30 mL  Lot: [id number] Expiration: OCT-31-2026 NDC: 9590-8033-97  Dx:G43.709 B/B Witnessed by: Maurilio Molt, RN

## 2024-06-17 NOTE — Progress Notes (Signed)
 06/17/24 ALL: Catherine Hartman presents to start Botox  tx for migraine prevention. She was last seen by Dr Ines 01/2024. Unable to tolerate Emgality  due to joint pain. Baseline daily headaches with > 15 migraine days per month.   Medications tried greater than 3 months that can be used in migraine management include:  Emgality  (significant joint pain), amitriptyline, she has also tried a calcium channel blocker with side effect, she examined her lifestyle and medication use (no medication oversue), topiramate(saw another neurologist), verapamil, blood pressure medications contraindicated due to hypotension, nurtec helped initially, Imitrex, Phenergan , prednisone , Zofran ,  Consent Form Botulism Toxin Injection For Chronic Migraine    Reviewed orally with patient, additionally signature is on file:  Botulism toxin has been approved by the Federal drug administration for treatment of chronic migraine. Botulism toxin does not cure chronic migraine and it may not be effective in some patients.  The administration of botulism toxin is accomplished by injecting a small amount of toxin into the muscles of the neck and head. Dosage must be titrated for each individual. Any benefits resulting from botulism toxin tend to wear off after 3 months with a repeat injection required if benefit is to be maintained. Injections are usually done every 3-4 months with maximum effect peak achieved by about 2 or 3 weeks. Botulism toxin is expensive and you should be sure of what costs you will incur resulting from the injection.  The side effects of botulism toxin use for chronic migraine may include:   -Transient, and usually mild, facial weakness with facial injections  -Transient, and usually mild, head or neck weakness with head/neck injections  -Reduction or loss of forehead facial animation due to forehead muscle weakness  -Eyelid drooping  -Dry eye  -Pain at the site of injection or bruising at the site of  injection  -Double vision  -Potential unknown long term risks   Contraindications: You should not have Botox  if you are pregnant, nursing, allergic to albumin, have an infection, skin condition, or muscle weakness at the site of the injection, or have myasthenia gravis, Lambert-Eaton syndrome, or ALS.  It is also possible that as with any injection, there may be an allergic reaction or no effect from the medication. Reduced effectiveness after repeated injections is sometimes seen and rarely infection at the injection site may occur. All care will be taken to prevent these side effects. If therapy is given over a long time, atrophy and wasting in the muscle injected may occur. Occasionally the patient's become refractory to treatment because they develop antibodies to the toxin. In this event, therapy needs to be modified.  I have read the above information and consent to the administration of botulism toxin.    BOTOX  PROCEDURE NOTE FOR MIGRAINE HEADACHE  Contraindications and precautions discussed with patient(above). Aseptic procedure was observed and patient tolerated procedure. Procedure performed by Greig Forbes, FNP-C.   The condition has existed for more than 6 months, and pt does not have a diagnosis of ALS, Myasthenia Gravis or Lambert-Eaton Syndrome.  Risks and benefits of injections discussed and pt agrees to proceed with the procedure.  Written consent obtained  These injections are medically necessary. Pt  receives good benefits from these injections. These injections do not cause sedations or hallucinations which the oral therapies may cause.   Description of procedure:  The patient was placed in a sitting position. The standard protocol was used for Botox  as follows, with 5 units of Botox  injected at each site:  -Procerus  muscle, midline injection  -Corrugator muscle, bilateral injection  -Frontalis muscle, bilateral injection, with 2 sites each side, medial injection was  performed in the upper one third of the frontalis muscle, in the region vertical from the medial inferior edge of the superior orbital rim. The lateral injection was again in the upper one third of the forehead vertically above the lateral limbus of the cornea, 1.5 cm lateral to the medial injection site.  -Temporalis muscle injection, 4 sites, bilaterally. The first injection was 3 cm above the tragus of the ear, second injection site was 1.5 cm to 3 cm up from the first injection site in line with the tragus of the ear. The third injection site was 1.5-3 cm forward between the first 2 injection sites. The fourth injection site was 1.5 cm posterior to the second injection site. 5th site laterally in the temporalis  muscleat the level of the outer canthus.  -Occipitalis muscle injection, 3 sites, bilaterally. The first injection was done one half way between the occipital protuberance and the tip of the mastoid process behind the ear. The second injection site was done lateral and superior to the first, 1 fingerbreadth from the first injection. The third injection site was 1 fingerbreadth superiorly and medially from the first injection site.  -Cervical paraspinal muscle injection, 2 sites, bilaterally. The first injection site was 1 cm from the midline of the cervical spine, 3 cm inferior to the lower border of the occipital protuberance. The second injection site was 1.5 cm superiorly and laterally to the first injection site.  -Trapezius muscle injection was performed at 3 sites, bilaterally. The first injection site was in the upper trapezius muscle halfway between the inflection point of the neck, and the acromion. The second injection site was one half way between the acromion and the first injection site. The third injection was done between the first injection site and the inflection point of the neck.   Will return for repeat injection in 3 months.   A total of 200 units of Botox  was prepared,  155 units of Botox  was injected as documented above, any Botox  not injected was wasted. The patient tolerated the procedure well, there were no complications of the above procedure.

## 2024-08-06 NOTE — Progress Notes (Deleted)
 No chief complaint on file.   HISTORY OF PRESENT ILLNESS:  08/06/24 ALL:  Catherine Hartman is a 58 y.o. female here today for follow up for migraines. She was last seen by me for Botox  06/2024. Since,     Sumatriptan   HISTORY (copied from Dr Sharion previous note)  04/30/2024: Emgality  with significant side effects. The cgrp medications are contraindicated(aimovig,ajovy,emgality ). Patient still with daily headaches, > 15 total moderate to severe migraines days a month, initiate botox  approval   HPI:  Catherine Hartman is a 58 y.o. female here as requested by Dyane Anthony RAMAN, FNP for migraines. has Headache; Dizziness; and Chronic migraine without aura without status migrainosus, not intractable on their problem list.   Patient reports she started having headaches at the age of 61 which were severe, responded to biofeedback, at age 34 she started having headaches over the frontal and occipital region, more unilateral on the left side of her face, with associated nausea, photophobia and phonophobia, hurts to move, vomiting, knifelike pulsating and pounding, she saw neurology in 2015 for dizziness and blurry vision and MRI at that time was normal, they are exacerbated by looking at the computer with blurry vision, Imitrex helps.  She also has associated neck tension.  No family history of migraines.  No aura. Has been worsening. She can't get through a day without a headache, daily headaches, > 15 total moderate to severe migraines days a month, she has new tinnitus/ringin gin the ear, dizziness, blurry vision, she can't even drive anymore (she did report dizziness, loss of focus, imbalance in 2015 and MRI was normal) but reports new tinnitus (advised to go to ENT and audiology). Disrupting her life, she can't drive anymore, migraines are moderate to severe, can last 4-24 hours, she can wake up with a headache and it can be positional. Worsening, daily, she has been taking too  much imitrex and stopped she examined her lifestyle, no aura and no medication overuse.    Reviewed notes, labs and imaging from outside physicians, which showed:   Medications tried greater than 3 months that can be used in migraine management include: Imitrex, Phenergan , prednisone , Zofran , amitriptyline, she has also tried a calcium channel blocker with side effect, she examined her lifestyle and medication use (no medication oversue), topiramate(saw another neurologist), verapamil, blood pressure medications contraindicated due to hypotension, nurtec helped initially,    From a review of records, patient saw Dr. Georjean in urology in 2015, for daily headaches and dizziness, MRI of the brain was normal, she started having severe headaches at the age of 15, had biofeedback with good results, at the age of 64 when she started having headaches over the frontal and occipital regions, she would have them in the left side of her face, like a knife in her brain, severe nausea photo and phonophobia, vomiting, imitrex, zofran , aimovig contraindicated due to constipation   REVIEW OF SYSTEMS: Out of a complete 14 system review of symptoms, the patient complains only of the following symptoms, and all other reviewed systems are negative.   ALLERGIES: Allergies  Allergen Reactions   Augmentin [Amoxicillin-Pot Clavulanate] Other (See Comments)    Abdominal pain   Other     Pt does not tolerate opioids and anesthesia - pt vomits violently      HOME MEDICATIONS: Outpatient Medications Prior to Visit  Medication Sig Dispense Refill   clonazePAM (KLONOPIN) 0.5 MG tablet Take 0.25 mg by mouth at bedtime.     COMBIPATCH  0.05-0.14 MG/DAY Place 1 patch onto the skin 2 (two) times a week.     Multiple Vitamins-Minerals (MULTIVITAMIN WITH MINERALS) tablet Take 1 tablet by mouth daily.     SUMAtriptan (IMITREX) 100 MG tablet Take 25 mg by mouth every 2 (two) hours as needed for migraine.     No  facility-administered medications prior to visit.     PAST MEDICAL HISTORY: Past Medical History:  Diagnosis Date   Anxiety    Migraine    Migraines      PAST SURGICAL HISTORY: Past Surgical History:  Procedure Laterality Date   GANGLION CYST EXCISION  age 63   KNEE SURGERY Left 12/2022   SPINE SURGERY     July 9th   vocal chord polyps       FAMILY HISTORY: Family History  Problem Relation Age of Onset   Hypertension Father      SOCIAL HISTORY: Social History   Socioeconomic History   Marital status: Single    Spouse name: Not on file   Number of children: Not on file   Years of education: Not on file   Highest education level: Not on file  Occupational History   Not on file  Tobacco Use   Smoking status: Former   Smokeless tobacco: Never  Substance and Sexual Activity   Alcohol use: Yes    Comment: occasional   Drug use: No   Sexual activity: Not on file  Other Topics Concern   Not on file  Social History Narrative   ** Merged History Encounter **       Social Drivers of Health   Financial Resource Strain: Not on file  Food Insecurity: Not on file  Transportation Needs: Not on file  Physical Activity: Not on file  Stress: Not on file  Social Connections: Unknown (02/21/2022)   Received from [facility]   Social Network    Social Network: Not on file  Intimate Partner Violence: Unknown (01/12/2022)   Received from [facility]   HITS    Physically Hurt: Not on file    Insult or Talk Down To: Not on file    Threaten Physical Harm: Not on file    Scream or Curse: Not on file     PHYSICAL EXAM  There were no vitals filed for this visit. There is no height or weight on file to calculate BMI.  Generalized: Well developed, in no acute distress  Cardiology: normal rate and rhythm, no murmur auscultated  Respiratory: clear to auscultation bilaterally    Neurological examination  Mentation: Alert oriented to time, place, history taking.  Follows all commands speech and language fluent Cranial nerve II-XII: Pupils were equal round reactive to light. Extraocular movements were full, visual field were full on confrontational test. Facial sensation and strength were normal. Uvula tongue midline. Head turning and shoulder shrug  were normal and symmetric. Motor: The motor testing reveals 5 over 5 strength of all 4 extremities. Good symmetric motor tone is noted throughout.  Sensory: Sensory testing is intact to soft touch on all 4 extremities. No evidence of extinction is noted.  Coordination: Cerebellar testing reveals good finger-nose-finger and heel-to-shin bilaterally.  Gait and station: Gait is normal. Tandem gait is normal. Romberg is negative. No drift is seen.  Reflexes: Deep tendon reflexes are symmetric and normal bilaterally.    DIAGNOSTIC DATA (LABS, IMAGING, TESTING) - I reviewed patient records, labs, notes, testing and imaging myself where available.  Lab Results  Component Value Date  WBC 7.2 08/05/2016   HGB 13.8 08/05/2016   HCT 40.7 08/05/2016   MCV 92.1 08/05/2016   PLT 198 08/05/2016      Component Value Date/Time   NA 140 08/05/2016 1425   K 3.6 08/05/2016 1425   CL 108 08/05/2016 1425   CO2 25 08/05/2016 1425   GLUCOSE 100 (H) 08/05/2016 1425   BUN 11 08/05/2016 1425   CREATININE 0.88 08/05/2016 1425   CALCIUM 9.0 08/05/2016 1425   PROT 6.8 08/05/2016 1425   ALBUMIN 4.1 08/05/2016 1425   AST 20 08/05/2016 1425   ALT 11 (L) 08/05/2016 1425   ALKPHOS 43 08/05/2016 1425   BILITOT 0.4 08/05/2016 1425   GFRNONAA >60 08/05/2016 1425   GFRAA >60 08/05/2016 1425   No results found for: CHOL, HDL, LDLCALC, LDLDIRECT, TRIG, CHOLHDL No results found for: YHAJ8R No results found for: VITAMINB12 No results found for: TSH      No data to display               No data to display           ASSESSMENT AND PLAN  58 y.o. year old female  has a past medical history of  Anxiety, Migraine, and Migraines. here with    No diagnosis found.  Aprel McKnight Tilden ***.  Healthy lifestyle habits encouraged. *** will follow up with PCP as directed. *** will return to see me in ***, sooner if needed. *** verbalizes understanding and agreement with this plan.   No orders of the defined types were placed in this encounter.    No orders of the defined types were placed in this encounter.    Greig Forbes, MSN, FNP-C 08/06/2024, 12:55 PM  [facility] [address] [place], [place] [address] [phone]

## 2024-08-06 NOTE — Patient Instructions (Incomplete)

## 2024-08-07 DIAGNOSIS — M5416 Radiculopathy, lumbar region: Secondary | ICD-10-CM | POA: Diagnosis not present

## 2024-08-11 ENCOUNTER — Ambulatory Visit: Admitting: Family Medicine

## 2024-08-28 DIAGNOSIS — M5416 Radiculopathy, lumbar region: Secondary | ICD-10-CM | POA: Diagnosis not present

## 2024-09-01 DIAGNOSIS — L720 Epidermal cyst: Secondary | ICD-10-CM | POA: Diagnosis not present

## 2024-09-09 DIAGNOSIS — M5416 Radiculopathy, lumbar region: Secondary | ICD-10-CM | POA: Diagnosis not present

## 2024-09-18 ENCOUNTER — Ambulatory Visit: Admitting: Family Medicine

## 2024-09-22 MED ORDER — ONABOTULINUMTOXINA 200 UNITS IJ SOLR
INTRAMUSCULAR | 3 refills | Status: AC
  Filled 2024-09-22 – 2024-10-03 (×2): qty 1, 84d supply, fill #0

## 2024-09-22 NOTE — Addendum Note (Signed)
 Addended by: HILLIARD HEATHER CROME on: 09/22/2024 05:35 PM   Modules accepted: Orders

## 2024-09-22 NOTE — Telephone Encounter (Signed)
 Received auth from her [employer] [facility] [employer], please send rx to [facility]. Her appointment is scheduled for 10/14/24.  Auth#: [id number] (09/22/24-03/23/25)

## 2024-09-22 NOTE — Telephone Encounter (Signed)
Botox 200 unit Rx sent to [facility].

## 2024-09-23 ENCOUNTER — Other Ambulatory Visit (HOSPITAL_COMMUNITY): Payer: Self-pay

## 2024-09-23 ENCOUNTER — Other Ambulatory Visit: Payer: Self-pay

## 2024-09-26 ENCOUNTER — Ambulatory Visit

## 2024-09-26 VITALS — BP 107/72 | HR 82 | Temp 97.7°F | Ht 62.0 in | Wt 113.2 lb

## 2024-09-26 DIAGNOSIS — L720 Epidermal cyst: Secondary | ICD-10-CM

## 2024-09-26 DIAGNOSIS — R220 Localized swelling, mass and lump, head: Secondary | ICD-10-CM

## 2024-09-26 NOTE — Progress Notes (Signed)
 NAME: Catherine Hartman  MRN: [id number]  DOB: Jan 26, 1966   Referring physician: Douglass Ivanoff, MD  PCP: Douglass Ivanoff, MD   CHIEF COMPLAINT: Soft tissue mass of the face    HPI:  Catherine Hartman is a 58 y.o. year old female who presents with a soft tissue mass that is non painful, soft and has enlarged over the last few months slowly. No episodes of infection noted. Its located below the orbital rim on the left side of the face.  PMH: Past Medical History:  Diagnosis Date   Anxiety    Migraine    Migraines    PSH:  Past Surgical History:  Procedure Laterality Date   GANGLION CYST EXCISION  age 32   KNEE SURGERY Left 12/2022   SPINE SURGERY     July 9th   vocal chord polyps      MEDICATIONS: Current Medications[1]  ALLERGIES:  is allergic to augmentin [amoxicillin-pot clavulanate] and other.   FAMILY HISTORY:  Family History  Problem Relation Age of Onset   Hypertension Father       VITALS:  Vitals:   09/26/24 1100  BP: 107/72  Pulse: 82  Temp: 97.7 F (36.5 C)  SpO2: 98%    Constitutional: Good color, good hydration. VSS. Head and Neck: No lymphadenopathy, thyromegaly or masses  Chest: Normal breathing, Normal shape and excursion.  Cor: RRR   Left facial subcutaneous mass measures 0.5 cm x 0.5 cm.  Has an obvious punctum. Currently not infected.  Mobile and not attached to underlying structures.  No basin lymphadenopathy, satellite lesions or in-transit lesions.    ASSESSMENT/PLAN  Assessment & Plan   Pt. presents with a subcutaneous mass most representative of epidermal inclusion cyst.  Today we discussed the risks, benefits and alternatives to mass excision and possible tissue rearrangement. We discussed the alternatives which include continued observation; however, I told the patient that I do not believe this mass will resolve on its own. We then discussed the benefits of surgical excision which include complete  removal of the lesion. We discussed the risks of excision which include seroma, hematoma, infection, bleeding, damage to surrounding healthy tissue, damage to surrounding structures and need for further surgery. We also discussed the risks of hair loss, wound separation and recurrence of the mass. We discussed scar patterns of tissue rearrangement, if needed.  I explained that the mass will be sent to pathology and if it were to be malignant further surgery may be needed. We discussed the risks of anesthesia. The patient has a good understanding of all the risks and benefits, postoperative course and care. We obtained pictures. All questions were answered.    Recommend excision in the treatment room. Risks, benefits and limitations were discussed. Procedure will be precertified and scheduled.  Nikala Walsworth M. Kristiane Morsch, MD [facility]       [1]  Current Outpatient Medications:    botulinum toxin Type A  (BOTOX ) 200 units injection, Provider to inject 155 units into the muscles of the head and neck every 12 weeks. Discard remainder., Disp: 1 each, Rfl: 3   clonazePAM (KLONOPIN) 0.5 MG tablet, Take 0.25 mg by mouth at bedtime., Disp: , Rfl:    COMBIPATCH 0.05-0.14 MG/DAY, Place 1 patch onto the skin 2 (two) times a week., Disp: , Rfl:    Multiple Vitamins-Minerals (MULTIVITAMIN WITH MINERALS) tablet, Take 1 tablet by mouth daily., Disp: , Rfl:    SUMAtriptan (IMITREX) 100 MG tablet, Take 25 mg by mouth  every 2 (two) hours as needed for migraine., Disp: , Rfl:

## 2024-10-03 ENCOUNTER — Other Ambulatory Visit: Payer: Self-pay

## 2024-10-03 ENCOUNTER — Other Ambulatory Visit (HOSPITAL_COMMUNITY): Payer: Self-pay

## 2024-10-03 NOTE — Progress Notes (Signed)
 Specialty Pharmacy Initial Fill Coordination Note  Catherine Hartman is a 58 y.o. female contacted today regarding initial fill of specialty medication(s) OnabotulinumtoxinA  (BOTOX )   Patient requested Courier to Provider Office   Delivery date: 10/07/24   Verified address: [facility], [address], [address], [place], [place] [address]   Medication will be filled on 10/06/2024.   Patient is aware of 0 copayment.

## 2024-10-06 ENCOUNTER — Other Ambulatory Visit: Payer: Self-pay

## 2024-10-13 ENCOUNTER — Telehealth: Payer: Self-pay | Admitting: Family Medicine

## 2024-10-13 NOTE — Telephone Encounter (Signed)
 Patient LVM at 10:41 am calling to schedule appointment and will call back to reschedule at a later time.

## 2024-10-14 ENCOUNTER — Ambulatory Visit: Admitting: Family Medicine

## 2024-10-30 ENCOUNTER — Other Ambulatory Visit (HOSPITAL_COMMUNITY): Admission: RE | Admit: 2024-10-30 | Discharge: 2024-10-30 | Disposition: A | Source: Ambulatory Visit

## 2024-10-30 ENCOUNTER — Ambulatory Visit

## 2024-10-30 VITALS — BP 102/62 | HR 68

## 2024-10-30 DIAGNOSIS — L720 Epidermal cyst: Secondary | ICD-10-CM

## 2024-10-30 MED ORDER — DOXYCYCLINE HYCLATE 100 MG PO TABS: 100.0000 mg | ORAL_TABLET | Freq: Two times a day (BID) | ORAL | 0 refills | Status: AC

## 2024-10-30 NOTE — Progress Notes (Signed)
 PROCEDURE NOTE PLASTIC SURGERY PROCEDURE NOTE   Procedure: Excision of face mass   Diagnosis: Mass of left face   Surgeon: Nieves HERO. Kerry-Anne Mezo, MD   Anesthesia: 1% lidocaine with epinephrine 1:100,000 Mixed with 0.25% Marcaine 50/50 dilution (total  5 mL)   Complications: None   Specimen: Cyst, sent to phatology    DESCRIPTION OF THE PROCEDURE IN DETAIL:   We discussed the risks benefits and alternatives to lesion excision. We discussed the alternatives which would be observation of the lesion. We discussed of the risks of the lesion then becoming larger or becoming infected, complicating any future excision. We discussed the benefits of excision which include removal of the lesion and the ability to examine it under a microscope to rule out the presence of a malignancy. We also discussed the benefits of removing the lesion from a symptomatic perspective. We discussed the risks of surgery, including but not limited to infection, bleeding, damage to the surrounding healthy tissues and the need for further surgery. We also discussed the risks of wound separation, poor scarring and lesion recurrence. We also discussed the risks of anesthesia. The patient again voiced their understanding of the above and once again confirmed informed consent.   The patient was identified by name, date of birth and medical record number. The patient's left face was prepped and draped in the standard sterile fashion using betadine solution. Timeout was conducted and preoperative instrument and needle counts were correct.    Local anesthesia was injected. The incision was then made around the mass. The mass was circumferentially dissected, and it should be noted that it had cystic appearance. The size of the mass was 0.5 cm x 0.5 cm. The mass was removed and sent as a pathological specimen. The residual 3 centimeter wound was closed in layers including dermis using 4-0 Vycril and epidermis using 5-0 Nylon. The  incision was dressed with bacitracin.    The patient tolerated the procedure well. There were no immediate complications. Postoperative instrument and needle counts were correct.    Postoperative care discussed, return to clinic in 14 days. Instructed to stay away from second hand smoking.   Undrea Shipes M. Ryler Laskowski, MD [facility]

## 2024-10-31 ENCOUNTER — Other Ambulatory Visit: Payer: Self-pay

## 2024-11-04 LAB — SURGICAL PATHOLOGY

## 2024-11-07 ENCOUNTER — Ambulatory Visit

## 2024-11-07 VITALS — BP 113/76 | HR 67

## 2024-11-07 DIAGNOSIS — L720 Epidermal cyst: Secondary | ICD-10-CM

## 2024-11-07 DIAGNOSIS — Z09 Encounter for follow-up examination after completed treatment for conditions other than malignant neoplasm: Secondary | ICD-10-CM

## 2024-12-05 ENCOUNTER — Ambulatory Visit

## 2025-02-04 ENCOUNTER — Ambulatory Visit
